# Patient Record
Sex: Male | Born: 1965 | Race: White | Hispanic: No | Marital: Married | Smoking: Former smoker
Health system: Southern US, Community
[De-identification: ages and names within clinical notes are randomized; demographics above are authoritative.]

## PROBLEM LIST (undated history)

## (undated) DIAGNOSIS — M1990 Unspecified osteoarthritis, unspecified site: Secondary | ICD-10-CM

## (undated) DIAGNOSIS — G4730 Sleep apnea, unspecified: Secondary | ICD-10-CM

## (undated) DIAGNOSIS — I10 Essential (primary) hypertension: Secondary | ICD-10-CM

## (undated) HISTORY — PX: ROTATOR CUFF REPAIR: SHX139

---

## 1987-02-18 HISTORY — PX: APPENDECTOMY: SHX54

## 2004-02-18 HISTORY — PX: KNEE ARTHROSCOPY: SHX127

## 2017-10-12 ENCOUNTER — Other Ambulatory Visit: Payer: Self-pay | Admitting: Sports Medicine

## 2017-10-12 DIAGNOSIS — M25561 Pain in right knee: Secondary | ICD-10-CM

## 2017-10-12 DIAGNOSIS — M25461 Effusion, right knee: Secondary | ICD-10-CM

## 2017-10-15 ENCOUNTER — Ambulatory Visit
Admission: RE | Admit: 2017-10-15 | Discharge: 2017-10-15 | Disposition: A | Discharge status: Home / Self Care | Payer: BLUE CROSS/BLUE SHIELD | Source: Ambulatory Visit | Attending: Sports Medicine | Admitting: Sports Medicine

## 2017-10-15 DIAGNOSIS — M1711 Unilateral primary osteoarthritis, right knee: Secondary | ICD-10-CM | POA: Insufficient documentation

## 2017-10-15 DIAGNOSIS — M25461 Effusion, right knee: Secondary | ICD-10-CM

## 2017-10-15 DIAGNOSIS — M7121 Synovial cyst of popliteal space [Baker], right knee: Secondary | ICD-10-CM | POA: Insufficient documentation

## 2017-10-15 DIAGNOSIS — X58XXXA Exposure to other specified factors, initial encounter: Secondary | ICD-10-CM | POA: Insufficient documentation

## 2017-10-15 DIAGNOSIS — S83231A Complex tear of medial meniscus, current injury, right knee, initial encounter: Secondary | ICD-10-CM | POA: Insufficient documentation

## 2017-10-15 DIAGNOSIS — M25561 Pain in right knee: Secondary | ICD-10-CM | POA: Diagnosis present

## 2017-10-22 ENCOUNTER — Other Ambulatory Visit: Payer: Self-pay

## 2017-10-22 ENCOUNTER — Encounter
Admission: RE | Admit: 2017-10-22 | Discharge: 2017-10-22 | Disposition: A | Discharge status: Home / Self Care | Payer: BLUE CROSS/BLUE SHIELD | Source: Ambulatory Visit | Attending: Orthopedic Surgery | Admitting: Orthopedic Surgery

## 2017-10-22 HISTORY — DX: Sleep apnea, unspecified: G47.30

## 2017-10-22 HISTORY — DX: Essential (primary) hypertension: I10

## 2017-10-22 HISTORY — DX: Unspecified osteoarthritis, unspecified site: M19.90

## 2017-10-22 NOTE — Patient Instructions (Signed)
Your procedure is scheduled on: 10-26-17 Report to Same Day Surgery 2nd floor medical mall [facility] Entrance-take elevator on left to 2nd floor.  Check in with surgery information desk.) To find out your arrival time please call [phone] between 1PM - 3PM on 10-23-17  Remember: Instructions that are not followed completely may result in serious medical risk, up to and including death, or upon the discretion of your surgeon and anesthesiologist your surgery may need to be rescheduled.    _x___ 1. Do not eat food after midnight the night before your procedure. You may drink clear liquids up to 2 hours before you are scheduled to arrive at the hospital for your procedure.  Do not drink clear liquids within 2 hours of your scheduled arrival to the hospital.  Clear liquids include  --Water or Apple juice without pulp  --Clear carbohydrate beverage such as ClearFast or Gatorade  --Black Coffee or Clear Tea (No milk, no creamers, do not add anything to the coffee or Tea   ____Ensure clear carbohydrate drink on the way to the hospital for bariatric patients  ____Ensure clear carbohydrate drink 3 hours before surgery for Dr Dwyane Luo patients if physician instructed.   No gum chewing or hard candies.     __x__ 2. No Alcohol for 24 hours before or after surgery.   __x__3. No Smoking or e-cigarettes for 24 prior to surgery.  Do not use any chewable tobacco products for at least 6 hour prior to surgery   ____  4. Bring all medications with you on the day of surgery if instructed.    __x__ 5. Notify your doctor if there is any change in your medical condition     (cold, fever, infections).    x___6. On the morning of surgery brush your teeth with toothpaste and water.  You may rinse your mouth with mouth wash if you wish.  Do not swallow any toothpaste or mouthwash.   Do not wear jewelry, make-up, hairpins, clips or nail polish.  Do not wear lotions, powders, or perfumes. You may wear  deodorant.  Do not shave 48 hours prior to surgery. Men may shave face and neck.  Do not bring valuables to the hospital.    [facility] is not responsible for any belongings or valuables.               Contacts, dentures or bridgework may not be worn into surgery.  Leave your suitcase in the car. After surgery it may be brought to your room.  For patients admitted to the hospital, discharge time is determined by your treatment team.  _  Patients discharged the day of surgery will not be allowed to drive home.  You will need someone to drive you home and stay with you the night of your procedure.    Please read over the following fact sheets that you were given:   [facility] Preparing for Surgery and or MRSA Information   _x___ TAKE THE FOLLOWING MEDICATION THE MORNING OF SURGERY WITH A SMALL SIP OF WATER. These include:  1. OTEZLA  2.  3.  4.  5.  6.  ____Fleets enema or Magnesium Citrate as directed.   ____ Use CHG Soap or sage wipes as directed on instruction sheet   ____ Use inhalers on the day of surgery and bring to hospital day of surgery  ____ Stop Metformin and Janumet 2 days prior to surgery.    ____ Take 1/2 of usual insulin dose  the night before surgery and none on the morning     surgery.   ____ Follow recommendations from Cardiologist, Pulmonologist or PCP regarding          stopping Aspirin, Coumadin, Plavix ,Eliquis, Effient, or Pradaxa, and Pletal.  X____Stop Anti-inflammatories such as Advil, Aleve, Ibuprofen, Motrin, Naproxen, MELOXICAM, Naprosyn, Goodies powders or aspirin products NOW-OK to take Tylenol   _x___ Stop supplements until after surgery-STOP MELATONIN NOW   _X___ Bring C-Pap to the hospital.

## 2017-10-22 NOTE — Pre-Procedure Instructions (Signed)
PT NEEDING TO COME IN FOR LABS AND EKG PREOP-PT STATES HE IS OUT OF TOWN AND WILL NOT BE COMING BACK UNTIL Friday EVENING.  SURGERY ON Monday.  WILL HAVE TO GET LABS AND EKG DONE AM OF SURGERY

## 2017-10-25 MED ORDER — DEXTROSE 5 % IV SOLN
3.0000 g | Freq: Once | INTRAVENOUS | Status: AC
  Administered 2017-10-26: 3 g via INTRAVENOUS
  Filled 2017-10-25: qty 3000

## 2017-10-26 ENCOUNTER — Other Ambulatory Visit: Payer: Self-pay

## 2017-10-26 ENCOUNTER — Ambulatory Visit: Payer: BLUE CROSS/BLUE SHIELD | Admitting: Anesthesiology

## 2017-10-26 ENCOUNTER — Encounter
Admission: RE | Disposition: A | Discharge status: Home / Self Care | Payer: Self-pay | Source: Ambulatory Visit | Attending: Orthopedic Surgery

## 2017-10-26 ENCOUNTER — Ambulatory Visit
Admission: RE | Admit: 2017-10-26 | Discharge: 2017-10-26 | Disposition: A | Discharge status: Home / Self Care | Payer: BLUE CROSS/BLUE SHIELD | Source: Ambulatory Visit | Attending: Orthopedic Surgery | Admitting: Orthopedic Surgery

## 2017-10-26 ENCOUNTER — Encounter: Payer: Self-pay | Admitting: *Deleted

## 2017-10-26 DIAGNOSIS — M6751 Plica syndrome, right knee: Secondary | ICD-10-CM | POA: Diagnosis not present

## 2017-10-26 DIAGNOSIS — S83231A Complex tear of medial meniscus, current injury, right knee, initial encounter: Secondary | ICD-10-CM | POA: Diagnosis not present

## 2017-10-26 DIAGNOSIS — X500XXA Overexertion from strenuous movement or load, initial encounter: Secondary | ICD-10-CM | POA: Insufficient documentation

## 2017-10-26 DIAGNOSIS — Z79899 Other long term (current) drug therapy: Secondary | ICD-10-CM | POA: Diagnosis not present

## 2017-10-26 DIAGNOSIS — M2341 Loose body in knee, right knee: Secondary | ICD-10-CM | POA: Diagnosis not present

## 2017-10-26 DIAGNOSIS — I10 Essential (primary) hypertension: Secondary | ICD-10-CM | POA: Insufficient documentation

## 2017-10-26 DIAGNOSIS — L409 Psoriasis, unspecified: Secondary | ICD-10-CM | POA: Insufficient documentation

## 2017-10-26 DIAGNOSIS — Y9289 Other specified places as the place of occurrence of the external cause: Secondary | ICD-10-CM | POA: Insufficient documentation

## 2017-10-26 DIAGNOSIS — Z8489 Family history of other specified conditions: Secondary | ICD-10-CM | POA: Insufficient documentation

## 2017-10-26 DIAGNOSIS — Z9109 Other allergy status, other than to drugs and biological substances: Secondary | ICD-10-CM | POA: Diagnosis not present

## 2017-10-26 DIAGNOSIS — S83241A Other tear of medial meniscus, current injury, right knee, initial encounter: Secondary | ICD-10-CM | POA: Diagnosis present

## 2017-10-26 HISTORY — PX: KNEE ARTHROSCOPY WITH MEDIAL MENISECTOMY: SHX5651

## 2017-10-26 LAB — BASIC METABOLIC PANEL
Anion gap: 7 (ref 5–15)
BUN: 15 mg/dL (ref 6–20)
CO2: 27 mmol/L (ref 22–32)
CREATININE: 0.88 mg/dL (ref 0.61–1.24)
Calcium: 9 mg/dL (ref 8.9–10.3)
Chloride: 103 mmol/L (ref 98–111)
GFR calc Af Amer: >60 mL/min (ref >60)
GFR calc non Af Amer: >60 mL/min (ref >60)
Glucose, Bld: 99 mg/dL (ref 70–99)
Potassium: 3.7 mmol/L (ref 3.5–5.1)
SODIUM: 137 mmol/L (ref 135–145)

## 2017-10-26 SURGERY — ARTHROSCOPY, KNEE, WITH MEDIAL MENISCECTOMY
Anesthesia: General | Site: Knee | Laterality: Right | Wound class: "Clean "

## 2017-10-26 MED ORDER — EPINEPHRINE 30 MG/30ML IJ SOLN
INTRAMUSCULAR | Status: AC
  Filled 2017-10-26: qty 1

## 2017-10-26 MED ORDER — SUGAMMADEX SODIUM 500 MG/5ML IV SOLN
INTRAVENOUS | Status: AC
  Filled 2017-10-26: qty 5

## 2017-10-26 MED ORDER — FENTANYL CITRATE (PF) 100 MCG/2ML IJ SOLN
INTRAMUSCULAR | Status: DC | PRN
  Administered 2017-10-26: 50 ug via INTRAVENOUS
  Administered 2017-10-26: 200 ug via INTRAVENOUS

## 2017-10-26 MED ORDER — ONDANSETRON 4 MG PO TBDP: 4.0000 mg | ORAL_TABLET | Freq: Three times a day (TID) | ORAL | 0 refills | Status: DC | PRN

## 2017-10-26 MED ORDER — FENTANYL CITRATE (PF) 100 MCG/2ML IJ SOLN
INTRAMUSCULAR | Status: AC
  Filled 2017-10-26: qty 2

## 2017-10-26 MED ORDER — KETOROLAC TROMETHAMINE 30 MG/ML IJ SOLN
INTRAMUSCULAR | Status: DC | PRN
  Administered 2017-10-26: 30 mg via INTRAVENOUS

## 2017-10-26 MED ORDER — FENTANYL CITRATE (PF) 250 MCG/5ML IJ SOLN
INTRAMUSCULAR | Status: AC
  Filled 2017-10-26: qty 5

## 2017-10-26 MED ORDER — ASPIRIN EC 325 MG PO TBEC: 325.0000 mg | DELAYED_RELEASE_TABLET | Freq: Every day | ORAL | 0 refills | Status: AC

## 2017-10-26 MED ORDER — LACTATED RINGERS IV SOLN
INTRAVENOUS | Status: DC
  Administered 2017-10-26: 11:00:00 via INTRAVENOUS

## 2017-10-26 MED ORDER — ACETAMINOPHEN 500 MG PO TABS: 1000.0000 mg | ORAL_TABLET | Freq: Three times a day (TID) | ORAL | 2 refills | Status: AC

## 2017-10-26 MED ORDER — HYDROCODONE-ACETAMINOPHEN 5-325 MG PO TABS
1.0000 | ORAL_TABLET | Freq: Once | ORAL | Status: AC
  Administered 2017-10-26: 1 via ORAL

## 2017-10-26 MED ORDER — HYDROCODONE-ACETAMINOPHEN 5-325 MG PO TABS
ORAL_TABLET | ORAL | Status: AC
  Filled 2017-10-26: qty 1

## 2017-10-26 MED ORDER — LIDOCAINE HCL (CARDIAC) PF 100 MG/5ML IV SOSY
PREFILLED_SYRINGE | INTRAVENOUS | Status: DC | PRN
  Administered 2017-10-26: 100 mg via INTRAVENOUS

## 2017-10-26 MED ORDER — PROPOFOL 10 MG/ML IV BOLUS
INTRAVENOUS | Status: DC | PRN
  Administered 2017-10-26: 200 mg via INTRAVENOUS

## 2017-10-26 MED ORDER — PROPOFOL 10 MG/ML IV BOLUS
INTRAVENOUS | Status: AC
  Filled 2017-10-26: qty 20

## 2017-10-26 MED ORDER — IBUPROFEN 800 MG PO TABS: 800.0000 mg | ORAL_TABLET | Freq: Three times a day (TID) | ORAL | 0 refills | Status: AC

## 2017-10-26 MED ORDER — ROCURONIUM BROMIDE 50 MG/5ML IV SOLN
INTRAVENOUS | Status: AC
  Filled 2017-10-26: qty 1

## 2017-10-26 MED ORDER — LACTATED RINGERS IV SOLN
INTRAVENOUS | Status: DC | PRN
  Administered 2017-10-26: 6 mL

## 2017-10-26 MED ORDER — HYDROCODONE-ACETAMINOPHEN 5-325 MG PO TABS: 1.0000 | ORAL_TABLET | ORAL | 0 refills | Status: DC | PRN

## 2017-10-26 MED ORDER — FAMOTIDINE 20 MG PO TABS
ORAL_TABLET | ORAL | Status: AC
  Filled 2017-10-26: qty 1

## 2017-10-26 MED ORDER — LIDOCAINE-EPINEPHRINE 1 %-1:100000 IJ SOLN
INTRAMUSCULAR | Status: DC | PRN
  Administered 2017-10-26: 6 mL

## 2017-10-26 MED ORDER — MIDAZOLAM HCL 2 MG/2ML IJ SOLN
INTRAMUSCULAR | Status: AC
  Filled 2017-10-26: qty 2

## 2017-10-26 MED ORDER — ROCURONIUM BROMIDE 100 MG/10ML IV SOLN
INTRAVENOUS | Status: DC | PRN
  Administered 2017-10-26: 10 mg via INTRAVENOUS
  Administered 2017-10-26: 50 mg via INTRAVENOUS
  Administered 2017-10-26: 10 mg via INTRAVENOUS

## 2017-10-26 MED ORDER — FENTANYL CITRATE (PF) 100 MCG/2ML IJ SOLN
25.0000 ug | INTRAMUSCULAR | Status: DC | PRN
  Administered 2017-10-26 (×3): 50 ug via INTRAVENOUS

## 2017-10-26 MED ORDER — BUPIVACAINE HCL (PF) 0.5 % IJ SOLN
INTRAMUSCULAR | Status: AC
  Filled 2017-10-26: qty 30

## 2017-10-26 MED ORDER — LIDOCAINE-EPINEPHRINE 1 %-1:100000 IJ SOLN
INTRAMUSCULAR | Status: AC
  Filled 2017-10-26: qty 1

## 2017-10-26 MED ORDER — PROMETHAZINE HCL 25 MG/ML IJ SOLN: 6.2500 mg | INTRAMUSCULAR | Status: DC | PRN

## 2017-10-26 MED ORDER — MIDAZOLAM HCL 2 MG/2ML IJ SOLN
INTRAMUSCULAR | Status: DC | PRN
  Administered 2017-10-26: 2 mg via INTRAVENOUS

## 2017-10-26 MED ORDER — LIDOCAINE HCL (PF) 2 % IJ SOLN
INTRAMUSCULAR | Status: AC
  Filled 2017-10-26: qty 10

## 2017-10-26 MED ORDER — ONDANSETRON HCL 4 MG/2ML IJ SOLN
INTRAMUSCULAR | Status: DC | PRN
  Administered 2017-10-26: 4 mg via INTRAVENOUS

## 2017-10-26 MED ORDER — ONDANSETRON HCL 4 MG/2ML IJ SOLN
INTRAMUSCULAR | Status: AC
  Filled 2017-10-26: qty 2

## 2017-10-26 MED ORDER — SUGAMMADEX SODIUM 200 MG/2ML IV SOLN
INTRAVENOUS | Status: DC | PRN
  Administered 2017-10-26: 260 mg via INTRAVENOUS

## 2017-10-26 MED ORDER — FAMOTIDINE 20 MG PO TABS
20.0000 mg | ORAL_TABLET | Freq: Once | ORAL | Status: AC
  Administered 2017-10-26: 20 mg via ORAL

## 2017-10-26 MED ORDER — BUPIVACAINE HCL (PF) 0.5 % IJ SOLN
INTRAMUSCULAR | Status: DC | PRN
  Administered 2017-10-26: 6 mL

## 2017-10-26 SURGICAL SUPPLY — 55 items
"PENCIL ELECTRO HAND CTR " (MISCELLANEOUS) IMPLANT
ADAPTER IRRIG TUBE 2 SPIKE SOL (ADAPTER) ×6 IMPLANT
BANDAGE ACE 6X5 VEL STRL LF (GAUZE/BANDAGES/DRESSINGS) ×3 IMPLANT
BLADE SURG SZ11 CARB STEEL (BLADE) ×3 IMPLANT
BNDG COHESIVE 6X5 TAN STRL LF (GAUZE/BANDAGES/DRESSINGS) ×3 IMPLANT
BNDG ESMARK 6X12 TAN STRL LF (GAUZE/BANDAGES/DRESSINGS) ×3 IMPLANT
BUR RADIUS 3.5 (BURR) IMPLANT
BUR RADIUS 4.0X18.5 (BURR) IMPLANT
CAST PADDING 6X4YD ST 30248 (SOFTGOODS) ×2
CHLORAPREP W/TINT 26ML (MISCELLANEOUS) ×3 IMPLANT
CLOSURE WOUND 1/2 X4 (GAUZE/BANDAGES/DRESSINGS)
COOLER POLAR GLACIER W/PUMP (MISCELLANEOUS) ×3 IMPLANT
CUFF TOURN 24 STER (MISCELLANEOUS) IMPLANT
CUFF TOURN 30 STER DUAL PORT (MISCELLANEOUS) IMPLANT
CUFF TOURN 34 STER (MISCELLANEOUS) ×2 IMPLANT
DEVICE SUCT BLK HOLE OR FLOOR (MISCELLANEOUS) ×1 IMPLANT
DRAPE IMP U-DRAPE 54X76 (DRAPES) ×3 IMPLANT
DRAPE LEGGINS SURG 28X43 STRL (DRAPES) IMPLANT
ELECT REM PT RETURN 9FT ADLT (ELECTROSURGICAL)
ELECTRODE REM PT RTRN 9FT ADLT (ELECTROSURGICAL) IMPLANT
GAUZE SPONGE 4X4 12PLY STRL (GAUZE/BANDAGES/DRESSINGS) ×3 IMPLANT
GLOVE BIOGEL PI IND STRL 8 (GLOVE) ×1 IMPLANT
GLOVE BIOGEL PI INDICATOR 8 (GLOVE) ×2
GLOVE SURG ORTHO 8.0 STRL STRW (GLOVE) ×6 IMPLANT
GOWN STRL REUS W/ TWL LRG LVL3 (GOWN DISPOSABLE) ×1 IMPLANT
GOWN STRL REUS W/ TWL XL LVL3 (GOWN DISPOSABLE) ×1 IMPLANT
GOWN STRL REUS W/TWL LRG LVL3 (GOWN DISPOSABLE) ×2
GOWN STRL REUS W/TWL XL LVL3 (GOWN DISPOSABLE) ×2
IV LACTATED RINGER IRRG 3000ML (IV SOLUTION) ×12
IV LR IRRIG 3000ML ARTHROMATIC (IV SOLUTION) ×4 IMPLANT
KIT TURNOVER KIT A (KITS) ×3 IMPLANT
MANIFOLD NEPTUNE II (INSTRUMENTS) ×3 IMPLANT
MAT ABSORB  FLUID 56X50 GRAY (MISCELLANEOUS) ×4
MAT ABSORB FLUID 56X50 GRAY (MISCELLANEOUS) ×2 IMPLANT
NDL MAYO CATGUT SZ5 (NEEDLE)
NDL SUT 5 .5 CRC TPR PNT MAYO (NEEDLE) IMPLANT
NEEDLE HYPO 22GX1.5 SAFETY (NEEDLE) ×3 IMPLANT
PACK ARTHROSCOPY KNEE (MISCELLANEOUS) ×3 IMPLANT
PAD ABD DERMACEA PRESS 5X9 (GAUZE/BANDAGES/DRESSINGS) ×6 IMPLANT
PAD WRAPON POLAR KNEE (MISCELLANEOUS) ×1 IMPLANT
PADDING CAST COTTON 6X4 ST (SOFTGOODS) ×1 IMPLANT
PENCIL ELECTRO HAND CTR (MISCELLANEOUS) IMPLANT
SET TUBE SUCT SHAVER OUTFL 24K (TUBING) ×3 IMPLANT
SET TUBE TIP INTRA-ARTICULAR (MISCELLANEOUS) ×3 IMPLANT
STRIP CLOSURE SKIN 1/2X4 (GAUZE/BANDAGES/DRESSINGS) IMPLANT
SUT ETHILON 3-0 FS-10 30 BLK (SUTURE) ×3
SUT MNCRL AB 4-0 PS2 18 (SUTURE) IMPLANT
SUT VIC AB 0 CT2 27 (SUTURE) IMPLANT
SUT VIC AB 2-0 CT2 27 (SUTURE) IMPLANT
SUTURE EHLN 3-0 FS-10 30 BLK (SUTURE) ×1 IMPLANT
TOWEL OR 17X26 4PK STRL BLUE (TOWEL DISPOSABLE) ×6 IMPLANT
TUBING ARTHRO INFLOW-ONLY STRL (TUBING) ×3 IMPLANT
WAND HAND CNTRL MULTIVAC 50 (MISCELLANEOUS) IMPLANT
WAND HAND CNTRL MULTIVAC 90 (MISCELLANEOUS) ×2 IMPLANT
WRAPON POLAR PAD KNEE (MISCELLANEOUS) ×3

## 2017-10-26 NOTE — Anesthesia Post-op Follow-up Note (Signed)
Anesthesia QCDR form completed.        

## 2017-10-26 NOTE — Anesthesia Procedure Notes (Signed)
Procedure Name: Intubation Date/Time: 10/26/2017 12:39 PM Performed by: Bernardo Heater, CRNA Pre-anesthesia Checklist: Patient identified, Emergency Drugs available, Suction available and Patient being monitored Patient Re-evaluated:Patient Re-evaluated prior to induction Oxygen Delivery Method: Circle system utilized Preoxygenation: Pre-oxygenation with 100% oxygen Induction Type: IV induction Laryngoscope Size: Mac and 3 Grade View: Grade I Tube size: 7.5 mm Number of attempts: 1 Placement Confirmation: ETT inserted through vocal cords under direct vision,  positive ETCO2 and breath sounds checked- equal and bilateral Secured at: 23 cm Tube secured with: Tape Dental Injury: Teeth and Oropharynx as per pre-operative assessment

## 2017-10-26 NOTE — Transfer of Care (Signed)
Immediate Anesthesia Transfer of Care Note  Patient: Timothy Doyle  Procedure(s) Performed: KNEE ARTHROSCOPY WITH MEDIAL MENISECTOMY (Right Knee)  Patient Location: PACU  Anesthesia Type:General  Level of Consciousness: awake and patient cooperative  Airway & Oxygen Therapy: Patient Spontanous Breathing and Patient connected to nasal cannula oxygen  Post-op Assessment: Report given to RN and Post -op Vital signs reviewed and stable  Post vital signs: Reviewed and stable  Last Vitals:  Vitals Value Taken Time  BP    Temp 36.4 C 10/26/2017  2:03 PM  Pulse 92 10/26/2017  2:03 PM  Resp 17 10/26/2017  2:03 PM  SpO2 100 % 10/26/2017  2:03 PM  Vitals shown include unvalidated device data.  Last Pain:  Vitals:   10/26/17 1048  TempSrc: Temporal  PainSc: 4          Complications: No apparent anesthesia complications

## 2017-10-26 NOTE — H&P (Signed)
Paper H&P to be scanned into permanent record. H&P reviewed. No significant changes noted.  

## 2017-10-26 NOTE — Op Note (Signed)
Operative Note    SURGERY DATE: 10/26/2017   PRE-OP DIAGNOSIS:  1. Right medial meniscus tear   POST-OP DIAGNOSIS:  1. Right medial meniscus tear 2. Right patella degenerative changes 3. Right knee loose bodies  4. Right knee medial plica   PROCEDURES:  1. Right knee arthroscopy, partial medial meniscectomy (parrot beak tear of posterior horn affecting ~40% meniscus width, and 80% radial tear of posterior horn meniscus root) 2. Chondroplasty of patellofemoral compartment 3. Removal of loose bodies from lateral compartment 4. Medial plica excision   SURGEON: Cato Mulligan, MD   ANESTHESIA: Gen   ESTIMATED BLOOD LOSS: minimal   TOTAL IV FLUIDS: per anesthesia   INDICATION(S):  Timothy Doyle is a 52 y.o. male with signs and symptoms as well as MRI finding of medial meniscus tear. He had an acute injury when he rolled over in bed ~3 weeks ago. He failed non-operative management in the form of activity modification and NSAIDs. After discussion of risks, benefits, and alternatives to surgery, the patient elected to proceed.   OPERATIVE FINDINGS:    Examination under anesthesia: A careful examination under anesthesia was performed.  Passive range of motion was: Hyperextension: 2.  Extension: 0.  Flexion: 120.  Lachman: normal. Pivot Shift: normal.  Posterior drawer: normal.  Varus stability in full extension: normal.  Varus stability in 30 degrees of flexion: normal.  Valgus stability in full extension: normal.  Valgus stability in 30 degrees of flexion: normal.   Intra-operative findings: A thorough arthroscopic examination of the knee was performed.  The findings are: 1. Suprapatellar pouch: Normal 2. Undersurface of median ridge: Normal 3. Medial patellar facet: normal  4. Lateral patellar facet: Grade 2 degenerative changes 5. Trochlea: Grade 1 degenerative changes 6. Lateral gutter/popliteus tendon: two loose bodies, measuring ~43mm and 42mm in length 7. Hoffa's fat pad:  Inflamed 8. Medial gutter/plica: Significant medial plica 9. ACL: Normal 10. PCL: Normal 11. Medial meniscus: Complex tear with parrot beak tear of the posterior horn affecting ~40% meniscus width. There was an additional tear of the medial root, but ~20% of fibers were intact.  12. Medial compartment cartilage: Grade 2 degenerative changes to medial femoral condyle 13. Lateral meniscus: Normal 14. Lateral compartment cartilage: Grade 1 degenerative changes to tibial plateau; separate ~67mm loose body   OPERATIVE REPORT:     I identified Timothy Doyle in the pre-operative holding area. I marked the operative knee with my initials. I reviewed the risks and benefits of the proposed surgical intervention and the patient (and/or patient's guardian) wished to proceed. The patient was transferred to the operative suite and placed in the supine position with all bony prominences padded.  Anesthesia was administered. Appropriate IV antibiotics were administered prior to incision. The extremity was then prepped and draped in standard fashion. A time out was performed confirming the correct extremity, correct patient, and correct procedure.   Arthroscopy portals were marked. Local anesthetic was injected to the planned portal sites. The anterolateral portal was established with an 11 blade.      The arthroscope was placed in the anterolateral portal and then into the suprapatellar pouch.  A diagnostic knee scope was completed with the above findings. The medial meniscus tear was identified.   Next the medial portal was established under needle localization. Loose bodies were removed from the lateral gutter and lateral compartment with an arthroscopic shaver. The MCL was pie-crusted to improve visualization of the posterior horn. The meniscal tear was debrided using an arthroscopic biter  and an oscillating shaver until the meniscus had stable borders. A chondroplasty was performed of the medial compartment and  patellofemoral compartment such that there were stable cartilage edges without any loose fragments of cartilage. Arthroscopic fluid was removed from the joint.   The portals were closed with 3-0 Nylon suture. Sterile dressings included Xeroform, 4x4s, Sof-Rol, and Bias wrap. A Polarcare was placed.  The patient was then awakened and taken to the PACU hemodynamically stable without complication.     POSTOPERATIVE PLAN: The patient will be discharged home today once they meet PACU criteria. Aspirin 325 mg daily was prescribed for 2 weeks for DVT prophylaxis.  Physical therapy will start on POD#3-4. Weight-bearing as tolerated. They will follow up in 2 weeks per protocol.

## 2017-10-26 NOTE — Anesthesia Preprocedure Evaluation (Signed)
Anesthesia Evaluation  Patient identified by MRN, date of birth, ID band Patient awake    Reviewed: Allergy & Precautions, H&P , NPO status , Patient's Chart, lab work & pertinent test results, reviewed documented beta blocker date and time   History of Anesthesia Complications Negative for: history of anesthetic complications  Airway Mallampati: III  TM Distance: >3 FB Neck ROM: full    Dental  (+) Dental Advidsory Given, Teeth Intact   Pulmonary neg shortness of breath, sleep apnea and Continuous Positive Airway Pressure Ventilation , neg COPD, neg recent URI, former smoker,           Cardiovascular Exercise Tolerance: Good hypertension, (-) angina(-) CAD, (-) Past MI, (-) Cardiac Stents and (-) CABG (-) dysrhythmias (-) Valvular Problems/Murmurs     Neuro/Psych negative neurological ROS  negative psych ROS   GI/Hepatic negative GI ROS, Neg liver ROS,   Endo/Other  neg diabetesMorbid obesity  Renal/GU negative Renal ROS  negative genitourinary   Musculoskeletal   Abdominal   Peds  Hematology negative hematology ROS (+)   Anesthesia Other Findings Past Medical History: No date: Arthritis No date: Hypertension No date: Sleep apnea     Comment:  USES CPAP   Reproductive/Obstetrics negative OB ROS                             Anesthesia Physical Anesthesia Plan  ASA: III  Anesthesia Plan: General   Post-op Pain Management:    Induction: Intravenous  PONV Risk Score and Plan: 2 and Ondansetron, Dexamethasone and Treatment may vary due to age or medical condition  Airway Management Planned: LMA  Additional Equipment:   Intra-op Plan:   Post-operative Plan: Extubation in OR  Informed Consent: I have reviewed the patients History and Physical, chart, labs and discussed the procedure including the risks, benefits and alternatives for the proposed anesthesia with the patient or  authorized representative who has indicated his/her understanding and acceptance.   Dental Advisory Given  Plan Discussed with: Anesthesiologist, CRNA and Surgeon  Anesthesia Plan Comments:         Anesthesia Quick Evaluation

## 2017-10-26 NOTE — Discharge Instructions (Signed)
AMBULATORY SURGERY  DISCHARGE INSTRUCTIONS   1) The drugs that you were given will stay in your system until tomorrow so for the next 24 hours you should not:  A) Drive an automobile B) Make any legal decisions C) Drink any alcoholic beverage   2) You may resume regular meals tomorrow.  Today it is better to start with liquids and gradually work up to solid foods.  You may eat anything you prefer, but it is better to start with liquids, then soup and crackers, and gradually work up to solid foods.   3) Please notify your doctor immediately if you have any unusual bleeding, trouble breathing, redness and pain at the surgery site, drainage, fever, or pain not relieved by medication.    4) Additional Instructions:        Please contact your physician with any problems or Same Day Surgery at [phone], Monday through Friday 6 am to 4 pm, or [place] at [facility] number at [phone].Arthroscopic Knee Surgery - Partial Meniscectomy   Post-Op Instructions   1. Bracing or crutches: Crutches will be provided at the time of discharge from the surgery center if you do not already have them.   2. Ice: You may be provided with a device [facility]) that allows you to ice the affected area effectively. Otherwise you can ice manually.    3. Driving:  Plan on not driving for at least two weeks. Please note that you are advised NOT to drive while taking narcotic pain medications as you may be impaired and unsafe to drive.   4. Activity: Ankle pumps several times an hour while awake to prevent blood clots. Weight bearing: as tolerated. Use crutches for assistance with gait. Use until you are ambulating with crutches and without a limp. Can then wean from crutches. You can have assistance from your physical therapist with this. Bending and straightening the knee is unlimited. Elevate knee above heart level as much as possible for one week. Avoid standing more than 5 minutes  (consecutively) for the first week.  Avoid long distance travel for 2 weeks.  5. Medications:  - You have been provided a prescription for narcotic pain medicine. After surgery, take 1-2 narcotic tablets every 4 hours if needed for severe pain.  - You may take up to 3000mg /day of tylenol (acetaminophen). You can take 1000mg  3x/day. Please check your narcotic. If you have acetaminophen in your narcotic (each tablet will be 325mg ), be careful not to exceed a total of 3000mg /day of acetaminophen.  - A prescription for anti-nausea medication will be provided in case the narcotic medicine or anesthesia causes nausea - take 1 tablet every 6 hours only if nauseated.  - Take ibuprofen 800 mg every 8 hours WITH food to reduce post-operative knee swelling. DO NOT STOP IBUPROFEN POST-OP UNTIL INSTRUCTED TO DO SO at first post-op office visit (10-14 days after surgery). However, please discontinue if you have any abdominal discomfort after taking this.  - Take enteric coated aspirin 325 mg once daily for 2 weeks to prevent blood clots.    6. Bandages: The physical therapist should change the bandages at the first post-op appointment. If needed, the dressing supplies have been provided to you.   7. Physical Therapy: 1-2 times per week for 6 weeks. Therapy typically starts on post operative Day 3 or 4. You have been provided an order for physical therapy. The therapist will provide home exercises.   8. Work: May return to full work usually  around 2 weeks after 1st post-operative visit. May do light duty/desk job in approximately 1-2 weeks when off of narcotics, pain is well-controlled, and swelling has decreased. Labor intensive jobs may require 4-6 weeks to return.      9. Post-Op Appointments: Your first post-op appointment will be with Dr. Posey Pronto in approximately 2 weeks time.    If you find that they have not been scheduled please call the Orthopaedic Appointment front desk at [phone].

## 2017-10-27 ENCOUNTER — Encounter: Payer: Self-pay | Admitting: Orthopedic Surgery

## 2017-10-27 NOTE — Anesthesia Postprocedure Evaluation (Signed)
Anesthesia Post Note  Patient: Timothy Doyle  Procedure(s) Performed: KNEE ARTHROSCOPY WITH MEDIAL MENISECTOMY (Right Knee)  Patient location during evaluation: PACU Anesthesia Type: General Level of consciousness: awake and alert Pain management: pain level controlled Vital Signs Assessment: post-procedure vital signs reviewed and stable Respiratory status: spontaneous breathing, nonlabored ventilation, respiratory function stable and patient connected to nasal cannula oxygen Cardiovascular status: blood pressure returned to baseline and stable Postop Assessment: no apparent nausea or vomiting Anesthetic complications: no     Last Vitals:  Vitals:   10/26/17 1525 10/26/17 1651  BP: (!) 158/91 (!) 148/86  Pulse: 65 78  Resp: 16 15  Temp: 36.9 C   SpO2: 100% 99%    Last Pain:  Vitals:   10/26/17 1651  TempSrc:   PainSc: 3                  Martha Clan

## 2017-10-30 ENCOUNTER — Encounter: Payer: Self-pay | Admitting: Orthopedic Surgery

## 2017-12-16 ENCOUNTER — Other Ambulatory Visit: Payer: Self-pay | Admitting: Orthopedic Surgery

## 2017-12-16 DIAGNOSIS — M25562 Pain in left knee: Principal | ICD-10-CM

## 2017-12-16 DIAGNOSIS — G8929 Other chronic pain: Secondary | ICD-10-CM

## 2018-01-05 ENCOUNTER — Ambulatory Visit
Admission: RE | Admit: 2018-01-05 | Discharge: 2018-01-05 | Disposition: A | Discharge status: Home / Self Care | Payer: BLUE CROSS/BLUE SHIELD | Source: Ambulatory Visit | Attending: Orthopedic Surgery | Admitting: Orthopedic Surgery

## 2018-01-05 DIAGNOSIS — S83242A Other tear of medial meniscus, current injury, left knee, initial encounter: Secondary | ICD-10-CM | POA: Insufficient documentation

## 2018-01-05 DIAGNOSIS — X58XXXA Exposure to other specified factors, initial encounter: Secondary | ICD-10-CM | POA: Insufficient documentation

## 2018-01-05 DIAGNOSIS — M25562 Pain in left knee: Secondary | ICD-10-CM | POA: Insufficient documentation

## 2018-01-05 DIAGNOSIS — M794 Hypertrophy of (infrapatellar) fat pad: Secondary | ICD-10-CM | POA: Insufficient documentation

## 2018-01-05 DIAGNOSIS — S83282A Other tear of lateral meniscus, current injury, left knee, initial encounter: Secondary | ICD-10-CM | POA: Insufficient documentation

## 2018-01-05 DIAGNOSIS — M1712 Unilateral primary osteoarthritis, left knee: Secondary | ICD-10-CM | POA: Insufficient documentation

## 2018-01-05 DIAGNOSIS — G8929 Other chronic pain: Secondary | ICD-10-CM | POA: Insufficient documentation

## 2018-01-05 DIAGNOSIS — M25462 Effusion, left knee: Secondary | ICD-10-CM | POA: Diagnosis not present

## 2018-01-05 DIAGNOSIS — M7122 Synovial cyst of popliteal space [Baker], left knee: Secondary | ICD-10-CM | POA: Insufficient documentation

## 2018-02-02 ENCOUNTER — Other Ambulatory Visit: Payer: Self-pay

## 2018-02-02 ENCOUNTER — Encounter: Payer: Self-pay | Admitting: *Deleted

## 2018-02-11 ENCOUNTER — Ambulatory Visit
Admission: RE | Admit: 2018-02-11 | Discharge: 2018-02-11 | Disposition: A | Discharge status: Home / Self Care | Payer: BLUE CROSS/BLUE SHIELD | Source: Ambulatory Visit | Attending: Orthopedic Surgery | Admitting: Orthopedic Surgery

## 2018-02-11 ENCOUNTER — Encounter
Admission: RE | Disposition: A | Discharge status: Home / Self Care | Payer: Self-pay | Source: Ambulatory Visit | Attending: Orthopedic Surgery

## 2018-02-11 ENCOUNTER — Ambulatory Visit: Payer: BLUE CROSS/BLUE SHIELD | Admitting: Anesthesiology

## 2018-02-11 DIAGNOSIS — M659 Synovitis and tenosynovitis, unspecified: Secondary | ICD-10-CM | POA: Diagnosis not present

## 2018-02-11 DIAGNOSIS — G4730 Sleep apnea, unspecified: Secondary | ICD-10-CM | POA: Insufficient documentation

## 2018-02-11 DIAGNOSIS — Z87891 Personal history of nicotine dependence: Secondary | ICD-10-CM | POA: Insufficient documentation

## 2018-02-11 DIAGNOSIS — I10 Essential (primary) hypertension: Secondary | ICD-10-CM | POA: Insufficient documentation

## 2018-02-11 DIAGNOSIS — S83232A Complex tear of medial meniscus, current injury, left knee, initial encounter: Secondary | ICD-10-CM | POA: Insufficient documentation

## 2018-02-11 DIAGNOSIS — X501XXA Overexertion from prolonged static or awkward postures, initial encounter: Secondary | ICD-10-CM | POA: Diagnosis not present

## 2018-02-11 DIAGNOSIS — Z9989 Dependence on other enabling machines and devices: Secondary | ICD-10-CM | POA: Diagnosis not present

## 2018-02-11 HISTORY — PX: KNEE ARTHROSCOPY WITH MEDIAL MENISECTOMY: SHX5651

## 2018-02-11 SURGERY — ARTHROSCOPY, KNEE, WITH MEDIAL MENISCECTOMY
Anesthesia: General | Laterality: Left

## 2018-02-11 MED ORDER — ONDANSETRON 4 MG PO TBDP: 4.0000 mg | ORAL_TABLET | Freq: Three times a day (TID) | ORAL | 0 refills | Status: DC | PRN

## 2018-02-11 MED ORDER — CEFAZOLIN SODIUM-DEXTROSE 2-4 GM/100ML-% IV SOLN
2.0000 g | Freq: Once | INTRAVENOUS | Status: AC
  Administered 2018-02-11: 2 g via INTRAVENOUS

## 2018-02-11 MED ORDER — FENTANYL CITRATE (PF) 100 MCG/2ML IJ SOLN
INTRAMUSCULAR | Status: DC | PRN
  Administered 2018-02-11 (×4): 25 ug via INTRAVENOUS

## 2018-02-11 MED ORDER — ACETAMINOPHEN 160 MG/5ML PO SOLN: 325.0000 mg | ORAL | Status: DC | PRN

## 2018-02-11 MED ORDER — MIDAZOLAM HCL 5 MG/5ML IJ SOLN
INTRAMUSCULAR | Status: DC | PRN
  Administered 2018-02-11: 2 mg via INTRAVENOUS

## 2018-02-11 MED ORDER — HYDROCODONE-ACETAMINOPHEN 5-325 MG PO TABS: 1.0000 | ORAL_TABLET | ORAL | 0 refills | Status: DC | PRN

## 2018-02-11 MED ORDER — ACETAMINOPHEN 500 MG PO TABS: 1000.0000 mg | ORAL_TABLET | Freq: Three times a day (TID) | ORAL | 2 refills | Status: DC

## 2018-02-11 MED ORDER — LACTATED RINGERS IV SOLN
INTRAVENOUS | Status: DC
  Administered 2018-02-11: 13:00:00 via INTRAVENOUS

## 2018-02-11 MED ORDER — BUPIVACAINE HCL (PF) 0.5 % IJ SOLN
INTRAMUSCULAR | Status: DC | PRN
  Administered 2018-02-11: 10 mL
  Administered 2018-02-11: 4 mL

## 2018-02-11 MED ORDER — LIDOCAINE-EPINEPHRINE 1 %-1:100000 IJ SOLN
INTRAMUSCULAR | Status: DC | PRN
  Administered 2018-02-11: 10 mL
  Administered 2018-02-11: 4 mL

## 2018-02-11 MED ORDER — HYDROMORPHONE HCL 1 MG/ML IJ SOLN
0.2500 mg | INTRAMUSCULAR | Status: DC | PRN
  Administered 2018-02-11 (×2): 0.5 mg via INTRAVENOUS

## 2018-02-11 MED ORDER — ACETAMINOPHEN 325 MG PO TABS: 325.0000 mg | ORAL_TABLET | ORAL | Status: DC | PRN

## 2018-02-11 MED ORDER — PROPOFOL 10 MG/ML IV BOLUS
INTRAVENOUS | Status: DC | PRN
  Administered 2018-02-11: 200 mg via INTRAVENOUS

## 2018-02-11 MED ORDER — ONDANSETRON HCL 4 MG/2ML IJ SOLN
4.0000 mg | Freq: Once | INTRAMUSCULAR | Status: AC | PRN
  Administered 2018-02-11: 4 mg via INTRAVENOUS

## 2018-02-11 MED ORDER — DEXAMETHASONE SODIUM PHOSPHATE 4 MG/ML IJ SOLN
INTRAMUSCULAR | Status: DC | PRN
  Administered 2018-02-11: 4 mg via INTRAVENOUS

## 2018-02-11 MED ORDER — LIDOCAINE HCL (CARDIAC) PF 100 MG/5ML IV SOSY
PREFILLED_SYRINGE | INTRAVENOUS | Status: DC | PRN
  Administered 2018-02-11: 30 mg via INTRATRACHEAL

## 2018-02-11 MED ORDER — ASPIRIN EC 325 MG PO TBEC: 325.0000 mg | DELAYED_RELEASE_TABLET | Freq: Every day | ORAL | 0 refills | Status: AC

## 2018-02-11 MED ORDER — GLYCOPYRROLATE 0.2 MG/ML IJ SOLN
INTRAMUSCULAR | Status: DC | PRN
  Administered 2018-02-11: 0.1 mg via INTRAVENOUS

## 2018-02-11 MED ORDER — OXYCODONE HCL 5 MG/5ML PO SOLN: 5.0000 mg | Freq: Once | ORAL | Status: AC | PRN

## 2018-02-11 MED ORDER — OXYCODONE HCL 5 MG PO TABS
5.0000 mg | ORAL_TABLET | Freq: Once | ORAL | Status: AC | PRN
  Administered 2018-02-11: 5 mg via ORAL

## 2018-02-11 MED ORDER — IBUPROFEN 800 MG PO TABS: 800.0000 mg | ORAL_TABLET | Freq: Three times a day (TID) | ORAL | 0 refills | Status: AC

## 2018-02-11 SURGICAL SUPPLY — 38 items
ADAPTER IRRIG TUBE 2 SPIKE SOL (ADAPTER) ×6 IMPLANT
BLADE SURG SZ11 CARB STEEL (BLADE) ×3 IMPLANT
BNDG COHESIVE 4X5 TAN STRL (GAUZE/BANDAGES/DRESSINGS) ×3 IMPLANT
BNDG ESMARK 6X12 TAN STRL LF (GAUZE/BANDAGES/DRESSINGS) ×3 IMPLANT
BUR RADIUS 4.0X18.5 (BURR) ×2 IMPLANT
CHLORAPREP W/TINT 26ML (MISCELLANEOUS) ×3 IMPLANT
COOLER POLAR GLACIER W/PUMP (MISCELLANEOUS) ×3 IMPLANT
COVER LIGHT HANDLE UNIVERSAL (MISCELLANEOUS) ×6 IMPLANT
CUFF TOURN SGL QUICK 30 (MISCELLANEOUS) ×2
CUFF TRNQT CYL LO 30X4X (MISCELLANEOUS) IMPLANT
DRAPE IMP U-DRAPE 54X76 (DRAPES) ×3 IMPLANT
GAUZE SPONGE 4X4 12PLY STRL (GAUZE/BANDAGES/DRESSINGS) ×3 IMPLANT
GLOVE BIO SURGEON STRL SZ7.5 (GLOVE) ×3 IMPLANT
GLOVE BIOGEL PI IND STRL 8 (GLOVE) ×1 IMPLANT
GLOVE BIOGEL PI INDICATOR 8 (GLOVE) ×2
GOWN STRL REUS W/ TWL LRG LVL3 (GOWN DISPOSABLE) ×1 IMPLANT
GOWN STRL REUS W/ TWL XL LVL3 (GOWN DISPOSABLE) ×1 IMPLANT
GOWN STRL REUS W/TWL LRG LVL3 (GOWN DISPOSABLE) ×2
GOWN STRL REUS W/TWL XL LVL3 (GOWN DISPOSABLE) ×2
IV LACTATED RINGER IRRG 3000ML (IV SOLUTION) ×20
IV LR IRRIG 3000ML ARTHROMATIC (IV SOLUTION) ×4 IMPLANT
KIT TURNOVER KIT A (KITS) ×3 IMPLANT
MAT ABSORB  FLUID 56X50 GRAY (MISCELLANEOUS) ×2
MAT ABSORB FLUID 56X50 GRAY (MISCELLANEOUS) ×1 IMPLANT
NEPTUNE MANIFOLD (MISCELLANEOUS) ×3 IMPLANT
NS IRRIG 500ML POUR BTL (IV SOLUTION) ×3 IMPLANT
PACK ARTHROSCOPY KNEE (MISCELLANEOUS) ×3 IMPLANT
PAD ABD DERMACEA PRESS 5X9 (GAUZE/BANDAGES/DRESSINGS) ×2 IMPLANT
PAD WRAPON POLAR KNEE (MISCELLANEOUS) ×1 IMPLANT
PADDING CAST BLEND 6X4 STRL (MISCELLANEOUS) ×1 IMPLANT
PADDING STRL CAST 6IN (MISCELLANEOUS) ×2
SET TUBE SUCT SHAVER OUTFL 24K (TUBING) ×3 IMPLANT
SET TUBE TIP INTRA-ARTICULAR (MISCELLANEOUS) ×3 IMPLANT
SUT ETHILON 3-0 FS-10 30 BLK (SUTURE) ×3
SUTURE EHLN 3-0 FS-10 30 BLK (SUTURE) ×1 IMPLANT
TUBING ARTHRO INFLOW-ONLY STRL (TUBING) ×3 IMPLANT
WAND HAND CNTRL MULTIVAC 90 (MISCELLANEOUS) ×2 IMPLANT
WRAPON POLAR PAD KNEE (MISCELLANEOUS) ×3

## 2018-02-11 NOTE — Anesthesia Preprocedure Evaluation (Addendum)
Anesthesia Evaluation  Patient identified by MRN, date of birth, ID band Patient awake    Reviewed: Allergy & Precautions, H&P , NPO status , Patient's Chart, lab work & pertinent test results, reviewed documented beta blocker date and time   Airway Mallampati: II  TM Distance: >3 FB Neck ROM: full    Dental no notable dental hx.    Pulmonary sleep apnea and Continuous Positive Airway Pressure Ventilation , former smoker,    Pulmonary exam normal breath sounds clear to auscultation       Cardiovascular Exercise Tolerance: Good hypertension, Normal cardiovascular exam Rhythm:regular Rate:Normal     Neuro/Psych negative neurological ROS  negative psych ROS   GI/Hepatic negative GI ROS, Neg liver ROS,   Endo/Other  negative endocrine ROS  Renal/GU negative Renal ROS  negative genitourinary   Musculoskeletal   Abdominal   Peds  Hematology negative hematology ROS (+)   Anesthesia Other Findings   Reproductive/Obstetrics negative OB ROS                           Anesthesia Physical Anesthesia Plan  ASA: II  Anesthesia Plan: General LMA   Post-op Pain Management:    Induction:   PONV Risk Score and Plan: Ondansetron and Dexamethasone  Airway Management Planned:   Additional Equipment:   Intra-op Plan:   Post-operative Plan:   Informed Consent: I have reviewed the patients History and Physical, chart, labs and discussed the procedure including the risks, benefits and alternatives for the proposed anesthesia with the patient or authorized representative who has indicated his/her understanding and acceptance.   Dental Advisory Given  Plan Discussed with: CRNA and Anesthesiologist  Anesthesia Plan Comments:        Anesthesia Quick Evaluation

## 2018-02-11 NOTE — Anesthesia Postprocedure Evaluation (Signed)
Anesthesia Post Note  Patient: Timothy Doyle  Procedure(s) Performed: KNEE ARTHROSCOPY WITH PARTIAL MEDIAL MENISECTOMY   patellofemoral synovectomy.  (Left )  Patient location during evaluation: PACU Anesthesia Type: General Level of consciousness: awake and alert Pain management: pain level controlled Vital Signs Assessment: post-procedure vital signs reviewed and stable Respiratory status: spontaneous breathing, nonlabored ventilation, respiratory function stable and patient connected to nasal cannula oxygen Cardiovascular status: blood pressure returned to baseline and stable Postop Assessment: no apparent nausea or vomiting Anesthetic complications: no    Trecia Rogers

## 2018-02-11 NOTE — Anesthesia Procedure Notes (Signed)
Procedure Name: LMA Insertion Date/Time: 02/11/2018 2:35 PM Performed by: Cameron Ali, CRNA Pre-anesthesia Checklist: Patient identified, Emergency Drugs available, Suction available, Timeout performed and Patient being monitored Patient Re-evaluated:Patient Re-evaluated prior to induction Oxygen Delivery Method: Circle system utilized Preoxygenation: Pre-oxygenation with 100% oxygen Induction Type: IV induction LMA: LMA inserted LMA Size: 4.0 Number of attempts: 1 Placement Confirmation: positive ETCO2 and breath sounds checked- equal and bilateral Tube secured with: Tape Dental Injury: Teeth and Oropharynx as per pre-operative assessment

## 2018-02-11 NOTE — Op Note (Signed)
Operative Note    SURGERY DATE: 02/11/2018   PRE-OP DIAGNOSIS:  1. Left medial meniscus tear 2. Left patellofemoral synovitis   POST-OP DIAGNOSIS:  1. Left medial meniscus tear 2. Left patellofemoral synovitis   PROCEDURES:  1. Left knee arthroscopy, partial medial meniscectomy 2. Left knee patellofemoral compartment synovectomy   SURGEON: Cato Mulligan, MD   ANESTHESIA: Gen   ESTIMATED BLOOD LOSS: minimal   TOTAL IV FLUIDS: per anesthesia   INDICATION(S):  Timothy Doyle is a 52 y.o. male with signs and symptoms as well as MRI finding of medial meniscus tear as well as synovitis of the patellofemoral compartment with MRI appearance of PVNS. After discussion of risks, benefits, and alternatives to surgery, the patient elected to proceed.   OPERATIVE FINDINGS:    Examination under anesthesia: A careful examination under anesthesia was performed.  Passive range of motion was: Hyperextension: 2.  Extension: 0.  Flexion: 125.  Lachman: normal. Pivot Shift: normal.  Posterior drawer: normal.  Varus stability in full extension: normal.  Varus stability in 30 degrees of flexion: normal.  Valgus stability in full extension: normal.  Valgus stability in 30 degrees of flexion: normal.   Intra-operative findings: A thorough arthroscopic examination of the knee was performed.  The findings are: 1. Suprapatellar pouch: Significant synovitis and decreased volume in the suprapatellar pouch; cystic and inflamed appearance of synovial tissue 2. Undersurface of median ridge: Grade 1-2 degenerative changes 3. Medial patellar facet: Grade 1-2 degenerative changes 4. Lateral patellar facet:Grade 1-2 degenerative changes 5. Trochlea: Grade 1 degenerative changes 6. Lateral gutter/popliteus tendon: Normal 7. Hoffa's fat pad: Inflamed/synovitic 8. Medial gutter/plica: Normal 9. ACL: Normal 10. PCL: Normal 11. Medial meniscus: Complex tear with both radial/parrot beak component of the posterior horn  and longitudinal tear near the periphery of the posterior horn that affected ~80% of the meniscus width 12. Medial compartment cartilage: Grade 1-2 degenerative changes 13. Lateral meniscus: Normal 14. Lateral compartment cartilage: Normal   OPERATIVE REPORT:     I identified Timothy Doyle in the pre-operative holding area. I marked the operative knee with my initials. I reviewed the risks and benefits of the proposed surgical intervention and the patient (and/or patient's guardian) wished to proceed. The patient was transferred to the operative suite and placed in the supine position with all bony prominences padded.  Anesthesia was administered. Appropriate IV antibiotics were administered prior to incision. The extremity was then prepped and draped in standard fashion. A time out was performed confirming the correct extremity, correct patient, and correct procedure.   Arthroscopy portals were marked. Local anesthetic was injected to the planned portal sites. The anterolateral portal was established with an 11 blade.      The arthroscope was placed in the anterolateral portal and then into the suprapatellar pouch.  A diagnostic knee scope was completed with the above findings. The medial meniscus tear was identified.   Next the medial portal was established under needle localization. The MCL was pie-crusted to improve visualization of the posterior horn. The meniscal tear was debrided using an arthroscopic biter and an oscillating shaver until the meniscus had stable borders.    Next, samples of the cystic appearing synovial tissue in the patellofemoral compartment and suprapatellar pouch were obtained with a pituitary grasper.  These were sent for pathology.  Then, a combination of a shaver, grasper, and ArthroCare wand was used to perform a synovectomy of the patellofemoral compartment.  Afterwards, normal suprapatellar pouch volume was restored and all cystic-appearing and  synovitic appearing  tissue was removed.  Arthroscopic fluid was then removed from the joint.  The portals were closed with 3-0 Nylon suture. Sterile dressings included Xeroform, 4x4s, Sof-Rol, and Bias wrap. A Polarcare was placed.  The patient was then awakened and taken to the PACU hemodynamically stable without complication.     POSTOPERATIVE PLAN: The patient will be discharged home today once they meet PACU criteria. Aspirin 325 mg daily was prescribed for 2 weeks for DVT prophylaxis.  Physical therapy will start on POD#3-4. Weight-bearing as tolerated. Follow up in 2 weeks per protocol.

## 2018-02-11 NOTE — H&P (Signed)
Paper H&P to be scanned into permanent record. H&P reviewed. No significant changes noted.  

## 2018-02-11 NOTE — Transfer of Care (Signed)
Immediate Anesthesia Transfer of Care Note  Patient: Shia Eber  Procedure(s) Performed: KNEE ARTHROSCOPY WITH PARTIAL MEDIAL MENISECTOMY    (Left )  Patient Location: PACU  Anesthesia Type: General LMA  Level of Consciousness: awake, alert  and patient cooperative  Airway and Oxygen Therapy: Patient Spontanous Breathing and Patient connected to supplemental oxygen  Post-op Assessment: Post-op Vital signs reviewed, Patient's Cardiovascular Status Stable, Respiratory Function Stable, Patent Airway and No signs of Nausea or vomiting  Post-op Vital Signs: Reviewed and stable  Complications: No apparent anesthesia complications

## 2018-02-11 NOTE — Discharge Instructions (Signed)
Arthroscopic Knee Surgery - Partial Meniscectomy °  °Post-Op Instructions °  °1. Bracing or crutches: Crutches will be provided at the time of discharge from the surgery center if you do not already have them. °  °2. Ice: You may be provided with a device (Polar Care) that allows you to ice the affected area effectively. Otherwise you can ice manually.  °  °3. Driving:  Plan on not driving for at least two weeks. Please note that you are advised NOT to drive while taking narcotic pain medications as you may be impaired and unsafe to drive. °  °4. Activity: Ankle pumps several times an hour while awake to prevent blood clots. Weight bearing: as tolerated. Use crutches for as needed (usually ~1 week or less) until pain allows you to ambulate without a limp. Bending and straightening the knee is unlimited. Elevate knee above heart level as much as possible for one week. Avoid standing more than 5 minutes (consecutively) for the first week.  Avoid long distance travel for 2 weeks. ° °5. Medications:  °- You have been provided a prescription for narcotic pain medicine. After surgery, take 1-2 narcotic tablets every 4 hours if needed for severe pain.  °- You may take up to 3000mg/day of tylenol (acetaminophen). You can take 1000mg 3x/day. Please check your narcotic. If you have acetaminophen in your narcotic (each tablet will be 325mg), be careful not to exceed a total of 3000mg/day of acetaminophen.  °- A prescription for anti-nausea medication will be provided in case the narcotic medicine or anesthesia causes nausea - take 1 tablet every 6 hours only if nauseated.  °- Take ibuprofen 800 mg every 8 hours WITH food to reduce post-operative knee swelling. DO NOT STOP IBUPROFEN POST-OP UNTIL INSTRUCTED TO DO SO at first post-op office visit (10-14 days after surgery). However, please discontinue if you have any abdominal discomfort after taking this.  °- Take enteric coated aspirin 325 mg once daily for 2 weeks to prevent  blood clots.  ° ° °6. Bandages: The physical therapist should change the bandages at the first post-op appointment. If needed, the dressing supplies have been provided to you. °  °7. Physical Therapy: 1-2 times per week for 6 weeks. Therapy typically starts on post operative Day 3 or 4. You have been provided an order for physical therapy. The therapist will provide home exercises. °  °8. Work: May return to full work usually around 2 weeks after 1st post-operative visit. May do light duty/desk job in approximately 1-2 weeks when off of narcotics, pain is well-controlled, and swelling has decreased. Labor intensive jobs may require 4-6 weeks to return.  °  °  °9. Post-Op Appointments: °Your first post-op appointment will be with Dr. Clavin Ruhlman in approximately 2 weeks time.  °  °If you find that they have not been scheduled please call the Orthopaedic Appointment front desk at [phone]. ° ° ° ° °General Anesthesia, Adult, Care After °This sheet gives you information about how to care for yourself after your procedure. Your health care provider may also give you more specific instructions. If you have problems or questions, contact your health care provider. °What can I expect after the procedure? °After the procedure, the following side effects are common: °· Pain or discomfort at the IV site. °· Nausea. °· Vomiting. °· Sore throat. °· Trouble concentrating. °· Feeling cold or chills. °· Weak or tired. °· Sleepiness and fatigue. °· Soreness and body aches. These side effects can affect parts   of the body that were not involved in surgery. °Follow these instructions at home: ° °For at least 24 hours after the procedure: °· Have a responsible adult stay with you. It is important to have someone help care for you until you are awake and alert. °· Rest as needed. °· Do not: °? Participate in activities in which you could fall or become injured. °? Drive. °? Use heavy machinery. °? Drink alcohol. °? Take sleeping pills or  medicines that cause drowsiness. °? Make important decisions or sign legal documents. °? Take care of children on your own. °Eating and drinking °· Follow any instructions from your health care provider about eating or drinking restrictions. °· When you feel hungry, start by eating small amounts of foods that are soft and easy to digest (bland), such as toast. Gradually return to your regular diet. °· Drink enough fluid to keep your urine pale yellow. °· If you vomit, rehydrate by drinking water, juice, or clear broth. °General instructions °· If you have sleep apnea, surgery and certain medicines can increase your risk for breathing problems. Follow instructions from your health care provider about wearing your sleep device: °? Anytime you are sleeping, including during daytime naps. °? While taking prescription pain medicines, sleeping medicines, or medicines that make you drowsy. °· Return to your normal activities as told by your health care provider. Ask your health care provider what activities are safe for you. °· Take over-the-counter and prescription medicines only as told by your health care provider. °· If you smoke, do not smoke without supervision. °· Keep all follow-up visits as told by your health care provider. This is important. °Contact a health care provider if: °· You have nausea or vomiting that does not get better with medicine. °· You cannot eat or drink without vomiting. °· You have pain that does not get better with medicine. °· You are unable to pass urine. °· You develop a skin rash. °· You have a fever. °· You have redness around your IV site that gets worse. °Get help right away if: °· You have difficulty breathing. °· You have chest pain. °· You have blood in your urine or stool, or you vomit blood. °Summary °· After the procedure, it is common to have a sore throat or nausea. It is also common to feel tired. °· Have a responsible adult stay with you for the first 24 hours after general  anesthesia. It is important to have someone help care for you until you are awake and alert. °· When you feel hungry, start by eating small amounts of foods that are soft and easy to digest (bland), such as toast. Gradually return to your regular diet. °· Drink enough fluid to keep your urine pale yellow. °· Return to your normal activities as told by your health care provider. Ask your health care provider what activities are safe for you. °This information is not intended to replace advice given to you by your health care provider. Make sure you discuss any questions you have with your health care provider. °Document Released: 05/12/2000 Document Revised: 09/19/2016 Document Reviewed: 09/19/2016 °Elsevier Interactive Patient Education © 2019 [employer] ° °

## 2018-02-12 ENCOUNTER — Encounter: Payer: Self-pay | Admitting: Orthopedic Surgery

## 2018-02-15 LAB — SURGICAL PATHOLOGY

## 2018-11-24 ENCOUNTER — Inpatient Hospital Stay
Admission: EM | Admit: 2018-11-24 | Discharge: 2018-11-26 | DRG: 310 | Disposition: A | Discharge status: Home / Self Care | Payer: BC Managed Care – PPO | Attending: Internal Medicine | Admitting: Internal Medicine

## 2018-11-24 DIAGNOSIS — Z87891 Personal history of nicotine dependence: Secondary | ICD-10-CM

## 2018-11-24 DIAGNOSIS — R0602 Shortness of breath: Secondary | ICD-10-CM

## 2018-11-24 DIAGNOSIS — M1990 Unspecified osteoarthritis, unspecified site: Secondary | ICD-10-CM | POA: Diagnosis present

## 2018-11-24 DIAGNOSIS — Z9119 Patient's noncompliance with other medical treatment and regimen: Secondary | ICD-10-CM | POA: Diagnosis not present

## 2018-11-24 DIAGNOSIS — I10 Essential (primary) hypertension: Secondary | ICD-10-CM | POA: Diagnosis present

## 2018-11-24 DIAGNOSIS — L4050 Arthropathic psoriasis, unspecified: Secondary | ICD-10-CM | POA: Diagnosis present

## 2018-11-24 DIAGNOSIS — I4891 Unspecified atrial fibrillation: Principal | ICD-10-CM | POA: Diagnosis present

## 2018-11-24 DIAGNOSIS — G4733 Obstructive sleep apnea (adult) (pediatric): Secondary | ICD-10-CM | POA: Diagnosis present

## 2018-11-24 DIAGNOSIS — Z20828 Contact with and (suspected) exposure to other viral communicable diseases: Secondary | ICD-10-CM | POA: Diagnosis present

## 2018-11-24 DIAGNOSIS — I361 Nonrheumatic tricuspid (valve) insufficiency: Secondary | ICD-10-CM | POA: Diagnosis not present

## 2018-11-24 LAB — CBC
HCT: 42.4 % (ref 39.0–52.0)
Hemoglobin: 15.2 g/dL (ref 13.0–17.0)
MCH: 30.3 pg (ref 26.0–34.0)
MCHC: 35.8 g/dL (ref 30.0–36.0)
MCV: 84.6 fL (ref 80.0–100.0)
Platelets: 233 10*3/uL (ref 150–400)
RBC: 5.01 MIL/uL (ref 4.22–5.81)
RDW: 12.1 % (ref 11.5–15.5)
WBC: 9.1 10*3/uL (ref 4.0–10.5)
nRBC: 0 % (ref 0.0–0.2)

## 2018-11-24 LAB — COMPREHENSIVE METABOLIC PANEL
ALT: 24 U/L (ref 0–44)
AST: 17 U/L (ref 15–41)
Albumin: 4.9 g/dL (ref 3.5–5.0)
Alkaline Phosphatase: 58 U/L (ref 38–126)
Anion gap: 9 (ref 5–15)
BUN: 19 mg/dL (ref 6–20)
CO2: 28 mmol/L (ref 22–32)
Calcium: 9.8 mg/dL (ref 8.9–10.3)
Chloride: 104 mmol/L (ref 98–111)
Creatinine, Ser: 0.87 mg/dL (ref 0.61–1.24)
GFR calc Af Amer: >60 mL/min (ref >60)
GFR calc non Af Amer: >60 mL/min (ref >60)
Glucose, Bld: 138 mg/dL — ABNORMAL HIGH (ref 70–99)
Potassium: 3.5 mmol/L (ref 3.5–5.1)
Sodium: 141 mmol/L (ref 135–145)
Total Bilirubin: 0.7 mg/dL (ref 0.3–1.2)
Total Protein: 8.2 g/dL — ABNORMAL HIGH (ref 6.5–8.1)

## 2018-11-24 LAB — TROPONIN I (HIGH SENSITIVITY): Troponin I (High Sensitivity): 5 ng/L (ref <18)

## 2018-11-24 LAB — SARS CORONAVIRUS 2 BY RT PCR (HOSPITAL ORDER, PERFORMED IN ~~LOC~~ HOSPITAL LAB): SARS Coronavirus 2: NEGATIVE

## 2018-11-24 MED ORDER — HYDROCHLOROTHIAZIDE 25 MG PO TABS
25.0000 mg | ORAL_TABLET | Freq: Every day | ORAL | Status: DC
  Filled 2018-11-24: qty 1

## 2018-11-24 MED ORDER — APREMILAST 30 MG PO TABS: 30.0000 mg | ORAL_TABLET | Freq: Two times a day (BID) | ORAL | Status: DC

## 2018-11-24 MED ORDER — DILTIAZEM HCL 25 MG/5ML IV SOLN
INTRAVENOUS | Status: AC
  Filled 2018-11-24: qty 5

## 2018-11-24 MED ORDER — ADULT MULTIVITAMIN W/MINERALS CH
1.0000 | ORAL_TABLET | Freq: Every day | ORAL | Status: DC
  Administered 2018-11-25: 21:00:00 1 via ORAL
  Filled 2018-11-24: qty 1

## 2018-11-24 MED ORDER — HYDROCODONE-ACETAMINOPHEN 5-325 MG PO TABS: 1.0000 | ORAL_TABLET | ORAL | Status: DC | PRN

## 2018-11-24 MED ORDER — DILTIAZEM HCL 25 MG/5ML IV SOLN
10.0000 mg | Freq: Once | INTRAVENOUS | Status: AC
  Administered 2018-11-24: 21:00:00 10 mg via INTRAVENOUS

## 2018-11-24 MED ORDER — MELATONIN 5 MG PO TABS
5.0000 mg | ORAL_TABLET | Freq: Every day | ORAL | Status: DC
  Administered 2018-11-25: 22:00:00 5 mg via ORAL
  Filled 2018-11-24 (×2): qty 1

## 2018-11-24 MED ORDER — ACETAMINOPHEN 325 MG PO TABS
650.0000 mg | ORAL_TABLET | ORAL | Status: DC | PRN
  Administered 2018-11-25 – 2018-11-26 (×4): 650 mg via ORAL
  Filled 2018-11-24 (×4): qty 2

## 2018-11-24 MED ORDER — DILTIAZEM HCL 100 MG IV SOLR
5.0000 mg/h | INTRAVENOUS | Status: DC
  Administered 2018-11-24: 22:00:00 5 mg/h via INTRAVENOUS
  Administered 2018-11-25: 10 mg/h via INTRAVENOUS
  Filled 2018-11-24 (×2): qty 100

## 2018-11-24 MED ORDER — SODIUM CHLORIDE 0.9 % IV BOLUS
1000.0000 mL | Freq: Once | INTRAVENOUS | Status: AC
  Administered 2018-11-24: 1000 mL via INTRAVENOUS

## 2018-11-24 MED ORDER — DILTIAZEM HCL 100 MG IV SOLR: 10.0000 mg/h | INTRAVENOUS | Status: DC

## 2018-11-24 MED ORDER — ENOXAPARIN SODIUM 40 MG/0.4ML ~~LOC~~ SOLN
40.0000 mg | SUBCUTANEOUS | Status: DC
  Administered 2018-11-25: 40 mg via SUBCUTANEOUS
  Filled 2018-11-24: qty 0.4

## 2018-11-24 MED ORDER — ONDANSETRON HCL 4 MG/2ML IJ SOLN
4.0000 mg | Freq: Four times a day (QID) | INTRAMUSCULAR | Status: DC | PRN
  Administered 2018-11-24: 4 mg via INTRAVENOUS

## 2018-11-24 MED ORDER — LISINOPRIL 20 MG PO TABS
40.0000 mg | ORAL_TABLET | ORAL | Status: DC
  Administered 2018-11-26: 40 mg via ORAL
  Filled 2018-11-24 (×2): qty 2

## 2018-11-24 MED ORDER — ONDANSETRON HCL 4 MG/2ML IJ SOLN
INTRAMUSCULAR | Status: AC
  Filled 2018-11-24: qty 2

## 2018-11-24 NOTE — H&P (Signed)
[facility] [place] NAME: Timothy Doyle    MR#:  [id number]  DATE OF BIRTH:  20-Oct-1965  DATE OF ADMISSION:  11/24/2018  PRIMARY CARE PHYSICIAN: Tracie Harrier, MD   REQUESTING/REFERRING PHYSICIAN: Harvest Dark,  CHIEF COMPLAINT:   Chief Complaint  Patient presents with  . Chest Pain  . Shortness of Breath    HISTORY OF PRESENT ILLNESS:  53 y.o. male with pertinent past medical history of obstructive sleep apnea on CPAP, hypertension, and psoriatic arthritis presenting to the ED with chief complaints of palpitations, chest pressure and shortness of breath.  Patient report onset of symptoms since 1700 today, describes symptoms of sudden onset shortness of breath, chest pressure without pain, and palpitations as if he has been running a marathon. Endorses associated symptoms of dizziness, sore throat, nausea without vomiting. Denies associated symptoms of vomiting, diaphoresis, cough, fevers or chills, headache, or any recent sick contacts.  Denies illicit drug use or alcohol intake.  On arrival to the ED, he was afebrile with blood pressure 134/91 mm Hg and pulse rate 177 beats/min. There were no focal neurological deficits; he was alert and oriented x4, and he did not demonstrate any memory deficits.  ECG showed atrial fibrillation with rapid ventricular response at 177 bpm with a narrow QRS.  Patient was loaded with IV diltiazem but remains in A. fib with RVR therefore started on delta infusion.  Initial labs revealed unremarkable CBC, CMP and troponin.  Hospitalist asked to admit for further management.  PAST MEDICAL HISTORY:   Past Medical History:  Diagnosis Date  . Arthritis    psoriatic  . Hypertension   . Sleep apnea    USES CPAP    PAST SURGICAL HISTORY:   Past Surgical History:  Procedure Laterality Date  . APPENDECTOMY  1989  . KNEE ARTHROSCOPY  2006  . KNEE ARTHROSCOPY WITH MEDIAL MENISECTOMY Right 10/26/2017    Procedure: KNEE ARTHROSCOPY WITH MEDIAL MENISECTOMY;  Surgeon: Leim Fabry, MD;  Location: [facility];  Service: Orthopedics;  Laterality: Right;  . KNEE ARTHROSCOPY WITH MEDIAL MENISECTOMY Left 02/11/2018   Procedure: KNEE ARTHROSCOPY WITH PARTIAL MEDIAL MENISECTOMY   patellofemoral synovectomy. ;  Surgeon: Leim Fabry, MD;  Location: [facility];  Service: Orthopedics;  Laterality: Left;  sleep apnea  . ROTATOR CUFF REPAIR Right     SOCIAL HISTORY:   Social History   Tobacco Use  . Smoking status: Former Smoker    Packs/day: 1.00    Years: 12.00    Pack years: 12.00    Types: Cigarettes    Quit date: 2001    Years since quitting: 19.7  . Smokeless tobacco: Never Used  Substance Use Topics  . Alcohol use: Yes    Comment: OCC-3 DRINKS PER WEEK    FAMILY HISTORY:  No family history on file.  DRUG ALLERGIES:  No Known Allergies  REVIEW OF SYSTEMS:   Review of Systems  Constitutional: Negative for chills, fever, malaise/fatigue and weight loss.  HENT: Positive for sore throat. Negative for congestion and hearing loss.   Eyes: Negative for blurred vision and double vision.  Respiratory: Positive for shortness of breath. Negative for cough and wheezing.   Cardiovascular: Positive for palpitations. Negative for chest pain, orthopnea and leg swelling.       Chest pressure  Gastrointestinal: Negative for abdominal pain, diarrhea, nausea and vomiting.  Genitourinary: Negative for dysuria and urgency.  Musculoskeletal: Negative for myalgias.  Skin: Negative for rash.  Neurological: Positive for dizziness. Negative for sensory change, speech change, focal weakness and headaches.  Psychiatric/Behavioral: Negative for depression.   MEDICATIONS AT HOME:   Prior to Admission medications   Medication Sig Start Date End Date Taking? Authorizing Provider  acetaminophen (TYLENOL) 500 MG tablet Take 2 tablets (1,000 mg total) by mouth every 8 (eight) hours. 02/11/18 02/11/19   Leim Fabry, MD  Apremilast (OTEZLA) 30 MG TABS Take 30 mg by mouth 2 (two) times daily.    [provider]  hydrochlorothiazide (HYDRODIURIL) 25 MG tablet Take 25 mg by mouth daily. 08/07/17   [provider]  HYDROcodone-acetaminophen (NORCO) 5-325 MG tablet Take 1-2 tablets by mouth every 4 (four) hours as needed for moderate pain or severe pain. 02/11/18   Leim Fabry, MD  lisinopril (PRINIVIL,ZESTRIL) 40 MG tablet Take 40 mg by mouth every morning.  08/07/17   [provider]  Melatonin 5 MG TABS Take 5 mg by mouth at bedtime.    [provider]  Multiple Vitamin (MULTIVITAMIN WITH MINERALS) TABS tablet Take 1 tablet by mouth at bedtime.    [provider]  ondansetron (ZOFRAN ODT) 4 MG disintegrating tablet Take 1 tablet (4 mg total) by mouth every 8 (eight) hours as needed for nausea or vomiting. Patient not taking: Reported on 02/02/2018 10/26/17   Leim Fabry, MD  ondansetron (ZOFRAN ODT) 4 MG disintegrating tablet Take 1 tablet (4 mg total) by mouth every 8 (eight) hours as needed for nausea or vomiting. 02/11/18   Leim Fabry, MD      VITAL SIGNS:  Blood pressure (!) 136/95, pulse 85, temperature 97.8 F (36.6 C), temperature source Oral, resp. rate 17, SpO2 97 %.  PHYSICAL EXAMINATION:   Physical Exam  GENERAL:  53 y.o.-year-old patient lying in the bed with no acute distress.  EYES: Pupils equal, round, reactive to light and accommodation. No scleral icterus. Extraocular muscles intact.  HEENT: Head atraumatic, normocephalic. Oropharynx and nasopharynx clear.  NECK:  Supple, no jugular venous distention. No thyroid enlargement, no tenderness.  LUNGS: Normal breath sounds bilaterally, no wheezing, rales,rhonchi or crepitation. No use of accessory muscles of respiration.  CARDIOVASCULAR: S1, S2 normal. No murmurs, rubs, or gallops.  ABDOMEN: Soft, nontender, nondistended. Bowel sounds present. No organomegaly or mass.  EXTREMITIES:  No pedal edema, cyanosis, or clubbing.  NEUROLOGIC: Cranial nerves II through XII are intact. Muscle strength 5/5 in all extremities. Sensation intact. Gait not checked.  PSYCHIATRIC: The patient is alert and oriented x 3.  SKIN: No obvious rash, lesion, or ulcer.   DATA REVIEWED:  LABORATORY PANEL:   CBC Recent Labs  Lab 11/24/18 2058  WBC 9.1  HGB 15.2  HCT 42.4  PLT 233   ------------------------------------------------------------------------------------------------------------------  Chemistries  Recent Labs  Lab 11/24/18 2058  NA 141  K 3.5  CL 104  CO2 28  GLUCOSE 138*  BUN 19  CREATININE 0.87  CALCIUM 9.8  AST 17  ALT 24  ALKPHOS 58  BILITOT 0.7   ------------------------------------------------------------------------------------------------------------------  Cardiac Enzymes No results for input(s): TROPONINI in the last 168 hours. ------------------------------------------------------------------------------------------------------------------  RADIOLOGY:  No results found.  EKG:  EKG: unchanged from previous tracings, atrial fibrillation, rate 177 bmp.  IMPRESSION AND PLAN:   53 y.o. male with pertinent past medical history of obstructive sleep apnea on CPAP, hypertension, and psoriatic arthritis presenting to the ED with chief complaints of palpitations, chest pressure and shortness of breath  1. New onset Atrial Fibrillation with RVR - Admit to telemetry  unit - Troponins - TSH, FT4 - EtOH, UTox - CXR - TTEcho - Diltiazem 0.25mg /kg (15mg ) IV x1 given in the ED will continue with maintenance dose of 5-15mg /hr drip - Thromboembolism Risk Management Lovenox for now pending cardiology input for possible anticoagulation - Cardiology Consultation placed to Dr. Ubaldo Glassing. Message sent via [facility]  2. Obstructive sleep apnea - We will order CPAP for nighttime use  3. Hypertension - Continue lisinopril/hydrochlorothiazide for now as blood pressure  slightly elevated  4. DVT prophylaxis - Enoxaparin SubQ   All the records are reviewed and case discussed with ED provider. Management plans discussed with the patient, family and they are in agreement.  CODE STATUS: FULL  TOTAL TIME TAKING CARE OF THIS PATIENT: 50 minutes.    on 11/24/2018 at 10:46 PM  Rufina Falco, DNP, FNP-BC [facility] Nurse Practitioner Between 7am to 6pm - Pager [phone]  After 6pm go to [email or web address] - password [email or web address]  Office  [phone]  CC: Primary care physician; Tracie Harrier, MD

## 2018-11-24 NOTE — ED Triage Notes (Signed)
Pt presents to ED with sudden onset of sob and chest pressure since 1700. Denies hx of the same. Pt states he feels like he has been running and feels like he is having palpitations. Headache and body aches for the past few days.

## 2018-11-24 NOTE — ED Provider Notes (Signed)
[facility] Emergency Department Provider Note  Time seen: 9:00 PM  I have reviewed the triage vital signs and the nursing notes.   HISTORY  Chief Complaint Chest Pain and Shortness of Breath   HPI Timothy Doyle is a 53 y.o. male with a past medical history of hypertension, arthritis, presents emergency department for heart racing and chest pressure.  According to the patient ran 6 PM tonight after work he was sitting on the couch watching TV when he began feeling his heart racing and felt like he could not catch his breath.  Patient denies any history of the same.  Denies any "chest pain."  Patient does state mild cough and sore throat over the last couple days but denies any known fever.  No vomiting or diarrhea.   Past Medical History:  Diagnosis Date  . Arthritis    psoriatic  . Hypertension   . Sleep apnea    USES CPAP    There are no active problems to display for this patient.   Past Surgical History:  Procedure Laterality Date  . APPENDECTOMY  1989  . KNEE ARTHROSCOPY  2006  . KNEE ARTHROSCOPY WITH MEDIAL MENISECTOMY Right 10/26/2017   Procedure: KNEE ARTHROSCOPY WITH MEDIAL MENISECTOMY;  Surgeon: Leim Fabry, MD;  Location: [facility] ORS;  Service: Orthopedics;  Laterality: Right;  . KNEE ARTHROSCOPY WITH MEDIAL MENISECTOMY Left 02/11/2018   Procedure: KNEE ARTHROSCOPY WITH PARTIAL MEDIAL MENISECTOMY   patellofemoral synovectomy. ;  Surgeon: Leim Fabry, MD;  Location: [place];  Service: Orthopedics;  Laterality: Left;  sleep apnea  . ROTATOR CUFF REPAIR Right     Prior to Admission medications   Medication Sig Start Date End Date Taking? Authorizing Provider  acetaminophen (TYLENOL) 500 MG tablet Take 2 tablets (1,000 mg total) by mouth every 8 (eight) hours. 02/11/18 02/11/19  Leim Fabry, MD  Apremilast (OTEZLA) 30 MG TABS Take 30 mg by mouth 2 (two) times daily.    [provider]  hydrochlorothiazide (HYDRODIURIL) 25 MG  tablet Take 25 mg by mouth daily. 08/07/17   [provider]  HYDROcodone-acetaminophen (NORCO) 5-325 MG tablet Take 1-2 tablets by mouth every 4 (four) hours as needed for moderate pain or severe pain. 02/11/18   Leim Fabry, MD  lisinopril (PRINIVIL,ZESTRIL) 40 MG tablet Take 40 mg by mouth every morning.  08/07/17   [provider]  Melatonin 5 MG TABS Take 5 mg by mouth at bedtime.    [provider]  Multiple Vitamin (MULTIVITAMIN WITH MINERALS) TABS tablet Take 1 tablet by mouth at bedtime.    [provider]  ondansetron (ZOFRAN ODT) 4 MG disintegrating tablet Take 1 tablet (4 mg total) by mouth every 8 (eight) hours as needed for nausea or vomiting. Patient not taking: Reported on 02/02/2018 10/26/17   Leim Fabry, MD  ondansetron (ZOFRAN ODT) 4 MG disintegrating tablet Take 1 tablet (4 mg total) by mouth every 8 (eight) hours as needed for nausea or vomiting. 02/11/18   Leim Fabry, MD    No Known Allergies  No family history on file.  Social History Social History   Tobacco Use  . Smoking status: Former Smoker    Packs/day: 1.00    Years: 12.00    Pack years: 12.00    Types: Cigarettes    Quit date: 2001    Years since quitting: 19.7  . Smokeless tobacco: Never Used  Substance Use Topics  . Alcohol use: Yes    Comment: OCC-3 DRINKS  PER WEEK  . Drug use: Never    Review of Systems Constitutional: Negative for fever. Cardiovascular: Positive for chest pressure since 6 PM tonight. Respiratory: Mild shortness of breath.  Mild cough. Gastrointestinal: Negative for abdominal pain Musculoskeletal: Negative for musculoskeletal complaints Neurological: Negative for headache All other ROS negative  ____________________________________________   PHYSICAL EXAM:  VITAL SIGNS: ED Triage Vitals [11/24/18 2056]  Enc Vitals Group     BP (!) 134/91     Pulse Rate (!) 177     Resp (!) 22     Temp 97.8 F (36.6 C)     Temp Source Oral      SpO2 94 %     Weight      Height      Head Circumference      Peak Flow      Pain Score      Pain Loc      Pain Edu?      Excl. in [place]?     Constitutional: Alert and oriented. Well appearing and in no distress. Eyes: Normal exam ENT      Head: Normocephalic and atraumatic.      Mouth/Throat: Mucous membranes are moist. Cardiovascular: Irregular rhythm rate around 150 to 160 bpm. Respiratory: Normal respiratory effort without tachypnea nor retractions. Breath sounds are clear Gastrointestinal: Soft and nontender. No distention. Musculoskeletal: Nontender with normal range of motion in all extremities.  Neurologic:  Normal speech and language. No gross focal neurologic deficits  Skin:  Skin is warm, dry and intact.  Psychiatric: Mood and affect are normal.  ____________________________________________    EKG  EKG viewed and interpreted by myself shows atrial fibrillation with rapid ventricular response at 177 bpm with a narrow QRS, normal axis, largely normal intervals with nonspecific ST changes.  ____________________________________________   INITIAL IMPRESSION / ASSESSMENT AND PLAN / ED COURSE  Pertinent labs & imaging results that were available during my care of the patient were reviewed by me and considered in my medical decision making (see chart for details).   Patient presents emergency department for rapid heart rate feeling of shortness of breath.  Patient's EKG is consistent with atrial fibrillation with rapid ventricular response around 177 bpm on EKG.  Patient denies any history of A. fib.  Denies any recent heavy alcohol use.  Does drink occasional alcohol.  Denies any "chest pain" but does state some chest tightness and pressure since the heart rate began racing.  We will check labs, dose IV diltiazem and fluids and continue to closely monitor.  Patient agreeable to plan of care.  Patient remains in A. fib with RVR around 120 to 130 bpm despite 2 rounds of IV  diltiazem.  Will start on a diltiazem infusion.  Patient will require admission to the hospital service once remainder of his lab work has resulted.  Patient agreeable to plan of care.    Timothy Doyle was evaluated in Emergency Department on 11/24/2018 for the symptoms described in the history of present illness. He was evaluated in the context of the global COVID-19 pandemic, which necessitated consideration that the patient might be at risk for infection with the SARS-CoV-2 virus that causes COVID-19. Institutional protocols and algorithms that pertain to the evaluation of patients at risk for COVID-19 are in a state of rapid change based on information released by regulatory bodies including the CDC and federal and state organizations. These policies and algorithms were followed during the patient's care in the ED.  CRITICAL CARE  Performed by: Harvest Dark   Total critical care time: 30 minutes  Critical care time was exclusive of separately billable procedures and treating other patients.  Critical care was necessary to treat or prevent imminent or life-threatening deterioration.  Critical care was time spent personally by me on the following activities: development of treatment plan with patient and/or surrogate as well as nursing, discussions with consultants, evaluation of patient's response to treatment, examination of patient, obtaining history from patient or surrogate, ordering and performing treatments and interventions, ordering and review of laboratory studies, ordering and review of radiographic studies, pulse oximetry and re-evaluation of patient's condition.  ____________________________________________   FINAL CLINICAL IMPRESSION(S) / ED DIAGNOSES  New onset atrial fibrillation Atrial fibrillation with rapid ventricular response   Harvest Dark, MD 11/24/18 2148

## 2018-11-25 ENCOUNTER — Other Ambulatory Visit: Payer: Self-pay

## 2018-11-25 ENCOUNTER — Inpatient Hospital Stay: Payer: BC Managed Care – PPO

## 2018-11-25 ENCOUNTER — Inpatient Hospital Stay (HOSPITAL_COMMUNITY)
Admit: 2018-11-25 | Discharge: 2018-11-25 | Disposition: A | Discharge status: Home / Self Care | Payer: BC Managed Care – PPO | Attending: Nurse Practitioner | Admitting: Nurse Practitioner

## 2018-11-25 DIAGNOSIS — I361 Nonrheumatic tricuspid (valve) insufficiency: Secondary | ICD-10-CM

## 2018-11-25 LAB — HEMOGLOBIN A1C
Hgb A1c MFr Bld: 4.9 % (ref 4.8–5.6)
Mean Plasma Glucose: 93.93 mg/dL

## 2018-11-25 LAB — LIPID PANEL
Cholesterol: 159 mg/dL (ref 0–200)
HDL: 37 mg/dL — ABNORMAL LOW (ref >40)
LDL Cholesterol: 98 mg/dL (ref 0–99)
Total CHOL/HDL Ratio: 4.3 RATIO
Triglycerides: 122 mg/dL (ref <150)
VLDL: 24 mg/dL (ref 0–40)

## 2018-11-25 LAB — TSH: TSH: 4.886 u[IU]/mL — ABNORMAL HIGH (ref 0.350–4.500)

## 2018-11-25 LAB — ECHOCARDIOGRAM COMPLETE
Height: 70 in
Weight: 4250.47 oz

## 2018-11-25 LAB — TROPONIN I (HIGH SENSITIVITY): Troponin I (High Sensitivity): 11 ng/L (ref <18)

## 2018-11-25 LAB — GLUCOSE, CAPILLARY: Glucose-Capillary: 102 mg/dL — ABNORMAL HIGH (ref 70–99)

## 2018-11-25 LAB — HIV ANTIBODY (ROUTINE TESTING W REFLEX): HIV Screen 4th Generation wRfx: NONREACTIVE

## 2018-11-25 LAB — MRSA PCR SCREENING: MRSA by PCR: NEGATIVE

## 2018-11-25 MED ORDER — APIXABAN 5 MG PO TABS
5.0000 mg | ORAL_TABLET | Freq: Two times a day (BID) | ORAL | Status: DC
  Administered 2018-11-25 – 2018-11-26 (×2): 5 mg via ORAL
  Filled 2018-11-25 (×2): qty 1

## 2018-11-25 MED ORDER — DILTIAZEM HCL ER COATED BEADS 180 MG PO CP24
180.0000 mg | ORAL_CAPSULE | Freq: Every day | ORAL | Status: DC
  Administered 2018-11-25 – 2018-11-26 (×2): 180 mg via ORAL
  Filled 2018-11-25 (×2): qty 1

## 2018-11-25 MED ORDER — ENOXAPARIN SODIUM 120 MG/0.8ML ~~LOC~~ SOLN: 1.0000 mg/kg | SUBCUTANEOUS | Status: DC

## 2018-11-25 MED ORDER — ENOXAPARIN SODIUM 120 MG/0.8ML ~~LOC~~ SOLN: 1.0000 mg/kg | Freq: Two times a day (BID) | SUBCUTANEOUS | Status: DC

## 2018-11-25 MED ORDER — CHLORHEXIDINE GLUCONATE CLOTH 2 % EX PADS
6.0000 | MEDICATED_PAD | Freq: Every day | CUTANEOUS | Status: DC
  Administered 2018-11-25: 6 via TOPICAL

## 2018-11-25 MED ORDER — ENOXAPARIN SODIUM 80 MG/0.8ML ~~LOC~~ SOLN
80.0000 mg | Freq: Once | SUBCUTANEOUS | Status: AC
  Administered 2018-11-25: 80 mg via SUBCUTANEOUS
  Filled 2018-11-25: qty 0.8

## 2018-11-25 NOTE — ED Notes (Signed)
Pt given water at this time 

## 2018-11-25 NOTE — ED Notes (Signed)
Attempted to call report at this time 

## 2018-11-25 NOTE — Consult Note (Signed)
[facility] Cardiology  CARDIOLOGY CONSULT NOTE  Patient ID: Timothy Doyle MRN: [id number] DOB/AGE: 55-Mar-1967 55 y.o.  Admit date: 11/24/2018 Referring Physician Rufina Falco, NP Primary Physician [facility] Primary Cardiologist None per patient Reason for Consultation Atrial fibrillation with RVR  HPI: 54 year old gentleman referred for evaluation of new onset atrial fibrillation with RVR.  Patient has a history of hypertension and sleep apnea with several month history of noncompliance with CPAP.  The patient reports a recent history of feeling exertional dyspnea especially when climbing stairs.  Yesterday evening while sitting on the couch, the patient experienced acute onset of chest pressure with palpitations and shortness of breath.  When he checked his heart rate he noted that it was quite elevated, and decided to come to the ER for further evaluation.  He states that this has never happened to him before.  He denies a history of MI, stroke, heart failure, diabetes, hyperlipidemia, PE/DVT, or GI bleed.  ECG in the ER revealed atrial fibrillation at a rate of 177 bpm with nonspecific ST wave abnormality.  Chest x-ray was negative for acute cardiopulmonary process.  Admission labs notable for normal high-sensitivity troponin of 5, followed by 11, normal CBC, with TSH 4.886. He received IV diltiazem x 2 with marginal improvement of rate, and was started on diltiazem drip. The patient currently reports feeling much better.  He denies chest pain or shortness of breath.  He does have palpitations occasionally. He has a chads vasc score of 1 (HTN), and was started on Eliquis 5 mg BID.  Review of systems complete and found to be negative unless listed above     Past Medical History:  Diagnosis Date  . Arthritis    psoriatic  . Hypertension   . Sleep apnea    USES CPAP    Past Surgical History:  Procedure Laterality Date  . APPENDECTOMY  1989  . KNEE ARTHROSCOPY  2006  . KNEE ARTHROSCOPY WITH  MEDIAL MENISECTOMY Right 10/26/2017   Procedure: KNEE ARTHROSCOPY WITH MEDIAL MENISECTOMY;  Surgeon: Leim Fabry, MD;  Location: [facility] ORS;  Service: Orthopedics;  Laterality: Right;  . KNEE ARTHROSCOPY WITH MEDIAL MENISECTOMY Left 02/11/2018   Procedure: KNEE ARTHROSCOPY WITH PARTIAL MEDIAL MENISECTOMY   patellofemoral synovectomy. ;  Surgeon: Leim Fabry, MD;  Location: [place];  Service: Orthopedics;  Laterality: Left;  sleep apnea  . ROTATOR CUFF REPAIR Right     Medications Prior to Admission  Medication Sig Dispense Refill Last Dose  . Apremilast (OTEZLA) 30 MG TABS Take 30 mg by mouth 2 (two) times daily.   11/24/2018 at Unknown time  . hydrochlorothiazide (HYDRODIURIL) 25 MG tablet Take 25 mg by mouth daily.  2 11/24/2018 at Unknown time  . lisinopril (PRINIVIL,ZESTRIL) 40 MG tablet Take 40 mg by mouth every morning.   3 11/24/2018 at Unknown time  . Melatonin 5 MG TABS Take 5 mg by mouth at bedtime.   11/23/2018 at Unknown time   Social History   Socioeconomic History  . Marital status: Married    Spouse name: Not on file  . Number of children: Not on file  . Years of education: Not on file  . Highest education level: Not on file  Occupational History  . Not on file  Social Needs  . Financial resource strain: Not on file  . Food insecurity    Worry: Not on file    Inability: Not on file  . Transportation needs    Medical: Not on file  Non-medical: Not on file  Tobacco Use  . Smoking status: Former Smoker    Packs/day: 1.00    Years: 12.00    Pack years: 12.00    Types: Cigarettes    Quit date: 2001    Years since quitting: 19.7  . Smokeless tobacco: Never Used  Substance and Sexual Activity  . Alcohol use: Yes    Comment: OCC-3 DRINKS PER WEEK  . Drug use: Never  . Sexual activity: Not on file  Lifestyle  . Physical activity    Days per week: Not on file    Minutes per session: Not on file  . Stress: Not on file  Relationships  . Social Product manager on phone: Not on file    Gets together: Not on file    Attends religious service: Not on file    Active member of club or organization: Not on file    Attends meetings of clubs or organizations: Not on file    Relationship status: Not on file  . Intimate partner violence    Fear of current or ex partner: Not on file    Emotionally abused: Not on file    Physically abused: Not on file    Forced sexual activity: Not on file  Other Topics Concern  . Not on file  Social History Narrative  . Not on file    No family history on file.    Review of systems complete and found to be negative unless listed above      PHYSICAL EXAM  General: Well developed, well nourished, in no acute distress HEENT:  Normocephalic and atramatic Neck:  No JVD.  Lungs: Clear bilaterally to auscultation, normal effort of breathing on room air Heart: Irregularly irregular without gallops or murmurs.  Abdomen: Nondistended Msk:  Back normal, gait not assessed. Normal strength and tone for age. Extremities: No clubbing, cyanosis or edema.   Neuro: Alert and oriented X 3. Psych:  Good affect, responds appropriately  Labs:   Lab Results  Component Value Date   WBC 9.1 11/24/2018   HGB 15.2 11/24/2018   HCT 42.4 11/24/2018   MCV 84.6 11/24/2018   PLT 233 11/24/2018    Recent Labs  Lab 11/24/18 2058  NA 141  K 3.5  CL 104  CO2 28  BUN 19  CREATININE 0.87  CALCIUM 9.8  PROT 8.2*  BILITOT 0.7  ALKPHOS 58  ALT 24  AST 17  GLUCOSE 138*   No results found for: CKTOTAL, CKMB, CKMBINDEX, TROPONINI  Lab Results  Component Value Date   CHOL 159 11/25/2018   Lab Results  Component Value Date   HDL 37 (L) 11/25/2018   Lab Results  Component Value Date   LDLCALC 98 11/25/2018   Lab Results  Component Value Date   TRIG 122 11/25/2018   Lab Results  Component Value Date   CHOLHDL 4.3 11/25/2018   No results found for: LDLDIRECT    Radiology: Dg Chest 1 View  Result  Date: 11/25/2018 CLINICAL DATA:  Shortness of breath and chest pressure EXAM: CHEST  1 VIEW COMPARISON:  None. FINDINGS: The heart size and mediastinal contours are within normal limits. Both lungs are clear. The visualized skeletal structures are unremarkable. IMPRESSION: No acute cardiopulmonary process. Electronically Signed   By: Prudencio Pair M.D.   On: 11/25/2018 01:32    EKG: atrial fibrillation, rate 80s  ASSESSMENT AND PLAN:  1. New onset atrial fibrillation, presenting to the ER  in RVR, received IV diltiazem x 2, currently on diltiazem drip with rates in the 80s. Chads vasc score of 1. History of OSA with several month history of noncompliance of CPAP.  2. Hypertension, blood pressure initially soft this morning, on lisinopril and HCTZ at home.   Plan: 1. Discontinue Cardizem drip 2. Start oral Cardizem CD 180 mg  3. Hold lisinopril and HCTZ for now due recent lower blood pressure recordings. Consider resuming when blood pressure permits. 4. Continue Eliquis 5 mg BID for stroke prevention for now in anticipation of possible chemical or electrical cardioversion as outpatient. 5. Encouraged patient to use CPAP every night 6. Monitor TSH as outpatient as it was mildly elevated 7. Review 2D echocardiogram 8. Transfer to telemetry unit 9. Further recommendations pending patient's initial course and results of echocardiogram.    Signed: Clabe Seal PA-C 11/25/2018, 11:30 AM

## 2018-11-25 NOTE — ED Notes (Signed)
ED TO INPATIENT HANDOFF REPORT  ED Nurse Name and Phone #: gracie  S Name/Age/Gender Dirk Dress 53 y.o. male Room/Bed: ED05A/ED05A  Code Status   Code Status: Full Code  Home/SNF/Other Home Patient oriented to: self, place, time and situation Is this baseline? Yes   Triage Complete: Triage complete  Chief Complaint Shob/Chest pressure  Triage Note Pt presents to ED with sudden onset of sob and chest pressure since 1700. Denies hx of the same. Pt states he feels like he has been running and feels like he is having palpitations. Headache and body aches for the past few days.    Allergies No Known Allergies  Level of Care/Admitting Diagnosis ED Disposition    ED Disposition Condition [facility] Area: [place] [[id number]]  Level of Care: Telemetry [5]  Covid Evaluation: Confirmed COVID Negative  Diagnosis: Atrial fibrillation with rapid ventricular response [facility] [id number]  Admitting Physician: Eula Flax  Attending Physician: Rufina Falco ACHIENG [id number]  Estimated length of stay: past midnight tomorrow  Certification:: I certify this patient will need inpatient services for at least 2 midnights  PT Class (Do Not Modify): Inpatient [101]  PT Acc Code (Do Not Modify): Private [1]       B Medical/Surgery History Past Medical History:  Diagnosis Date  . Arthritis    psoriatic  . Hypertension   . Sleep apnea    USES CPAP   Past Surgical History:  Procedure Laterality Date  . APPENDECTOMY  1989  . KNEE ARTHROSCOPY  2006  . KNEE ARTHROSCOPY WITH MEDIAL MENISECTOMY Right 10/26/2017   Procedure: KNEE ARTHROSCOPY WITH MEDIAL MENISECTOMY;  Surgeon: Leim Fabry, MD;  Location: [facility] ORS;  Service: Orthopedics;  Laterality: Right;  . KNEE ARTHROSCOPY WITH MEDIAL MENISECTOMY Left 02/11/2018   Procedure: KNEE ARTHROSCOPY WITH PARTIAL MEDIAL MENISECTOMY   patellofemoral synovectomy. ;  Surgeon: Leim Fabry,  MD;  Location: [place];  Service: Orthopedics;  Laterality: Left;  sleep apnea  . ROTATOR CUFF REPAIR Right      A IV Location/Drains/Wounds Patient Lines/Drains/Airways Status   Active Line/Drains/Airways    Name:   Placement date:   Placement time:   Site:   Days:   Peripheral IV 11/24/18 Right Antecubital   11/24/18    2059    Antecubital   1   Incision (Closed) 10/26/17 Knee Right   10/26/17    1333     395   Incision (Closed) 02/11/18 Knee Left   02/11/18    1607     287   Incision - 2 Ports Other (Comment) Lateral Lateral   10/26/17    -     395          Intake/Output Last 24 hours No intake or output data in the 24 hours ending 11/25/18 0008  Labs/Imaging Results for orders placed or performed during the hospital encounter of 11/24/18 (from the past 48 hour(s))  CBC     Status: None   Collection Time: 11/24/18  8:58 PM  Result Value Ref Range   WBC 9.1 4.0 - 10.5 K/uL   RBC 5.01 4.22 - 5.81 MIL/uL   Hemoglobin 15.2 13.0 - 17.0 g/dL   HCT 42.4 39.0 - 52.0 %   MCV 84.6 80.0 - 100.0 fL   MCH 30.3 26.0 - 34.0 pg   MCHC 35.8 30.0 - 36.0 g/dL   RDW 12.1 11.5 - 15.5 %   Platelets 233 150 - 400 K/uL  nRBC 0.0 0.0 - 0.2 %    Comment: Performed at [facility], [address] [place], [place] [address]  Comprehensive metabolic panel     Status: Abnormal   Collection Time: 11/24/18  8:58 PM  Result Value Ref Range   Sodium 141 135 - 145 mmol/L   Potassium 3.5 3.5 - 5.1 mmol/L   Chloride 104 98 - 111 mmol/L   CO2 28 22 - 32 mmol/L   Glucose, Bld 138 (H) 70 - 99 mg/dL   BUN 19 6 - 20 mg/dL   Creatinine, Ser 0.87 0.61 - 1.24 mg/dL   Calcium 9.8 8.9 - 10.3 mg/dL   Total Protein 8.2 (H) 6.5 - 8.1 g/dL   Albumin 4.9 3.5 - 5.0 g/dL   AST 17 15 - 41 U/L   ALT 24 0 - 44 U/L   Alkaline Phosphatase 58 38 - 126 U/L   Total Bilirubin 0.7 0.3 - 1.2 mg/dL   GFR calc non Af Amer >60 >60 mL/min   GFR calc Af Amer >60 >60 mL/min   Anion gap 9 5 - 15     Comment: Performed at [facility], [place], [place] [address]  Troponin I (High Sensitivity)     Status: None   Collection Time: 11/24/18  8:58 PM  Result Value Ref Range   Troponin I (High Sensitivity) 5 <18 ng/L    Comment: (NOTE) Elevated high sensitivity troponin I (hsTnI) values and significant  changes across serial measurements may suggest ACS but many other  chronic and acute conditions are known to elevate hsTnI results.  Refer to the "Links" section for chest pain algorithms and additional  guidance. Performed at [facility], [address] [place], [place] [address]   SARS Coronavirus 2 [facility] order, Performed in [facility] hospital lab) Nasopharyngeal Nasopharyngeal Swab     Status: None   Collection Time: 11/24/18  8:58 PM   Specimen: Nasopharyngeal Swab  Result Value Ref Range   SARS Coronavirus 2 NEGATIVE NEGATIVE    Comment: (NOTE) If result is NEGATIVE SARS-CoV-2 target nucleic acids are NOT DETECTED. The SARS-CoV-2 RNA is generally detectable in upper and lower  respiratory specimens during the acute phase of infection. The lowest  concentration of SARS-CoV-2 viral copies this assay can detect is 250  copies / mL. A negative result does not preclude SARS-CoV-2 infection  and should not be used as the sole basis for treatment or other  patient management decisions.  A negative result may occur with  improper specimen collection / handling, submission of specimen other  than nasopharyngeal swab, presence of viral mutation(s) within the  areas targeted by this assay, and inadequate number of viral copies  (<250 copies / mL). A negative result must be combined with clinical  observations, patient history, and epidemiological information. If result is POSITIVE SARS-CoV-2 target nucleic acids are DETECTED. The SARS-CoV-2 RNA is generally detectable in upper and lower  respiratory specimens dur ing the acute phase of  infection.  Positive  results are indicative of active infection with SARS-CoV-2.  Clinical  correlation with patient history and other diagnostic information is  necessary to determine patient infection status.  Positive results do  not rule out bacterial infection or co-infection with other viruses. If result is PRESUMPTIVE POSTIVE SARS-CoV-2 nucleic acids MAY BE PRESENT.   A presumptive positive result was obtained on the submitted specimen  and confirmed on repeat testing.  While 2019 novel coronavirus  (SARS-CoV-2)  nucleic acids may be present in the submitted sample  additional confirmatory testing may be necessary for epidemiological  and / or clinical management purposes  to differentiate between  SARS-CoV-2 and other Sarbecovirus currently known to infect humans.  If clinically indicated additional testing with an alternate test  methodology [phone]) is advised. The SARS-CoV-2 RNA is generally  detectable in upper and lower respiratory sp ecimens during the acute  phase of infection. The expected result is Negative. Fact Sheet for Patients:  StrictlyIdeas.no Fact Sheet for Healthcare Providers: BankingDealers.co.za This test is not yet approved or cleared by the [place] FDA and has been authorized for detection and/or diagnosis of SARS-CoV-2 by FDA under an Emergency Use Authorization (EUA).  This EUA will remain in effect (meaning this test can be used) for the duration of the COVID-19 declaration under Section 564(b)(1) of the Act, 21 U.S.C. section 360bbb-3(b)(1), unless the authorization is terminated or revoked sooner. Performed at [facility], [address] [place], [place] [address]    No results found.  Pending Labs Unresulted Labs (From admission, onward)    Start     Ordered   11/24/18 2244  TSH  Add-on,   AD     11/24/18 2245   11/24/18 2244  Hemoglobin A1c  Add-on,   AD     11/24/18 2245    11/24/18 2244  Lipid panel  Add-on,   AD     11/24/18 2245   11/24/18 2241  HIV Antibody (routine testing w rflx)  (HIV Antibody (Routine testing w reflex) panel)  Once,   STAT     11/24/18 2245   11/24/18 2241  HIV4GL Save Tube  (HIV Antibody (Routine testing w reflex) panel)  Once,   STAT     11/24/18 2245          Vitals/Pain Today's Vitals   11/24/18 2302 11/24/18 2309 11/24/18 2330 11/24/18 2350  BP: 123/89  122/87 117/84  Pulse:      Resp: 20   18  Temp:      TempSrc:      SpO2: 99%  95% 96%  PainSc:  0-No pain      Isolation Precautions No active isolations  Medications Medications  diltiazem (CARDIZEM) 100 mg in dextrose 5 % 100 mL (1 mg/mL) infusion (10 mg/hr Intravenous Rate/Dose Change 11/24/18 2255)  Melatonin TABS 5 mg (has no administration in time range)  multivitamin with minerals tablet 1 tablet (has no administration in time range)  lisinopril (ZESTRIL) tablet 40 mg (has no administration in time range)  hydrochlorothiazide (HYDRODIURIL) tablet 25 mg (has no administration in time range)  HYDROcodone-acetaminophen (NORCO/VICODIN) 5-325 MG per tablet 1-2 tablet (has no administration in time range)  Apremilast TABS 30 mg (has no administration in time range)  acetaminophen (TYLENOL) tablet 650 mg (has no administration in time range)  ondansetron (ZOFRAN) injection 4 mg (4 mg Intravenous Given 11/24/18 2255)  enoxaparin (LOVENOX) injection 40 mg (has no administration in time range)  ondansetron (ZOFRAN) 4 MG/2ML injection (has no administration in time range)  diltiazem (CARDIZEM) injection 10 mg (10 mg Intravenous Given 11/24/18 2101)  sodium chloride 0.9 % bolus 1,000 mL (1,000 mLs Intravenous New Bag/Given 11/24/18 2103)  diltiazem (CARDIZEM) injection 10 mg (10 mg Intravenous Given 11/24/18 2115)    Mobility walks Low fall risk   Focused Assessments Cardiac Assessment Handoff:  Cardiac Rhythm: Atrial fibrillation No results found for: CKTOTAL,  CKMB, CKMBINDEX, TROPONINI No results found for: DDIMER Does  the Patient currently have chest pain? No      R Recommendations: See Admitting Provider Note  Report given to:   Additional Notes:

## 2018-11-25 NOTE — Progress Notes (Signed)
*  PRELIMINARY RESULTS* Echocardiogram 2D Echocardiogram has been performed.  Sherrie Sport 11/25/2018, 9:54 AM

## 2018-11-25 NOTE — Consult Note (Signed)
ANTICOAGULATION CONSULT NOTE:  Pharmacy Consult for: apixaban dosing Indication: atrial fibrillation  No Known Allergies  Patient Measurements: Height: 5\' 10"  (177.8 cm) Weight: 265 lb 10.5 oz (120.5 kg) IBW/kg (Calculated) : 73  Vital Signs: Temp: 97.8 F (36.6 C) (10/08 0200) Temp Source: Oral (10/08 0200) BP: 104/76 (10/08 0634) Pulse Rate: 145 (10/08 0700)  Labs: Recent Labs    11/24/18 2058 11/25/18 0141  HGB 15.2  --   HCT 42.4  --   PLT 233  --   CREATININE 0.87  --   TROPONINIHS 5 11   Estimated Creatinine Clearance: 124.8 mL/min (by C-G formula based on SCr of 0.87 mg/dL).  Assessment:  Timothy Doyle is a 61 YOM who was referred for evaluation of new-onset atrial fibrillation with RVR. He has a hx of HTN. Patient reports recent episodes of exertional dyspnea climbing stairs, along with an acute onset of dyspnea and palpitations at rest yesterday evening. Timothy Doyle presented to ED shortly after with a HR of 177bpm with a narrow QRS.   PMH includes: HTN, psoriatic arthritis, and sleep apnea.  Patient received 40mg  of enoxaparin this morning at 0811 and 80mg  at 1106 (120mg  total), but was discontinued shortly after with a plan to use apixaban 5mg  PO BID. CHA2DS2-VASc score of 1 (HTN). Results from ECHO pending.   Plan:   Based on enoxaparin administration this AM, will initiate apixaban 5mg  PO BID starting at 2200. Apixaban to be continued in anticipation of possible chemical or electrical cardioversion as outpatient.    Cardizem drip to be discontinued, with Cardizem CD 180mg  PO daily initiated. Blood pressure medications (lisinopril and HCTZ) to be held given recent low blood pressure, may be resumed when it has stabilized.   Monitoring:  Monitor CBC (platelets) every three days. Monitor for signs/sxs of bleeding, clotting, or stroke.  Raiford Simmonds, PharmD Candidate 11/25/2018,11:42 AM

## 2018-11-25 NOTE — Progress Notes (Signed)
[place] [facility] [place] NAME: Timothy Doyle    MR#:  [id number]  DATE OF BIRTH:  1965/12/19  SUBJECTIVE:  CHIEF COMPLAINT:   Chief Complaint  Patient presents with  . Chest Pain  . Shortness of Breath   -Came in with cardiac symptoms and was noted to be in A. fib with RVR.  Still remains on the Cardizem drip this morning. -Symptoms have improved some.  REVIEW OF SYSTEMS:  Review of Systems  Constitutional: Negative for chills, fever and malaise/fatigue.  HENT: Negative for ear discharge, hearing loss and nosebleeds.   Eyes: Negative for blurred vision and double vision.  Respiratory: Positive for shortness of breath. Negative for cough and wheezing.   Cardiovascular: Positive for chest pain and palpitations. Negative for leg swelling.  Gastrointestinal: Negative for abdominal pain, constipation, diarrhea, nausea and vomiting.  Genitourinary: Negative for dysuria.  Musculoskeletal: Negative for myalgias.  Neurological: Negative for dizziness, focal weakness, seizures, weakness and headaches.  Psychiatric/Behavioral: Negative for depression.    DRUG ALLERGIES:  No Known Allergies  VITALS:  Blood pressure (!) 131/98, pulse 93, temperature 98.1 F (36.7 C), temperature source Oral, resp. rate 10, height 5\' 10"  (1.778 m), weight 120.5 kg, SpO2 99 %.  PHYSICAL EXAMINATION:  Physical Exam   GENERAL:  53 y.o.-year-old over weight patient lying in the bed with no acute distress.  EYES: Pupils equal, round, reactive to light and accommodation. No scleral icterus. Extraocular muscles intact.  HEENT: Head atraumatic, normocephalic. Oropharynx and nasopharynx clear.  NECK:  Supple, no jugular venous distention. No thyroid enlargement, no tenderness.  LUNGS: Normal breath sounds bilaterally, no wheezing, rales,rhonchi or crepitation. No use of accessory muscles of respiration.  CARDIOVASCULAR: S1, S2 irregular rhythm but normal rate. No  murmurs, rubs, or gallops.  ABDOMEN: Soft, nontender, nondistended. Bowel sounds present. No organomegaly or mass.  EXTREMITIES: No pedal edema, cyanosis, or clubbing.  NEUROLOGIC: Cranial nerves II through XII are intact. Muscle strength 5/5 in all extremities. Sensation intact. Gait not checked.  PSYCHIATRIC: The patient is alert and oriented x 3.  SKIN: No obvious rash, lesion, or ulcer.    LABORATORY PANEL:   CBC Recent Labs  Lab 11/24/18 2058  WBC 9.1  HGB 15.2  HCT 42.4  PLT 233   ------------------------------------------------------------------------------------------------------------------  Chemistries  Recent Labs  Lab 11/24/18 2058  NA 141  K 3.5  CL 104  CO2 28  GLUCOSE 138*  BUN 19  CREATININE 0.87  CALCIUM 9.8  AST 17  ALT 24  ALKPHOS 58  BILITOT 0.7   ------------------------------------------------------------------------------------------------------------------  Cardiac Enzymes No results for input(s): TROPONINI in the last 168 hours. ------------------------------------------------------------------------------------------------------------------  RADIOLOGY:  Dg Chest 1 View  Result Date: 11/25/2018 CLINICAL DATA:  Shortness of breath and chest pressure EXAM: CHEST  1 VIEW COMPARISON:  None. FINDINGS: The heart size and mediastinal contours are within normal limits. Both lungs are clear. The visualized skeletal structures are unremarkable. IMPRESSION: No acute cardiopulmonary process. Electronically Signed   By: Prudencio Pair M.D.   On: 11/25/2018 01:32    EKG:   Orders placed or performed during the hospital encounter of 11/24/18  . EKG 12-Lead  . EKG 12-Lead  . EKG 12-lead    ASSESSMENT AND PLAN:   53 year old male with past medical history significant for hypertension, sleep apnea on CPAP presents to hospital secondary to chest pain, palpitations and dyspnea going on and off for 2 weeks now.  1.  New onset  atrial fibrillation with  rapid ventricular response-admitted to stepdown for Cardizem drip -Rate seems to be well controlled.  Will be transition to oral Cardizem today -TSH is within normal limits.  Echocardiogram is pending -Appreciate cardiology consult. -Patient started on Eliquis for anticoagulation and also in preparation for future chemical versus electrical cardioversion as outpatient if needed.  2.  Obstructive sleep apnea-has not been using his CPAP for 2 months now.  Recommended to start restart CPAP again and follow-up echocardiogram for right-sided changes  3.  Hypertension-on lisinopril, hydrochlorothiazide and Cardizem now.  Blood pressure is low normal, consider discontinuing lisinopril and HCTZ.  4.  DVT prophylaxis-Eliquis     All the records are reviewed and case discussed with Care Management/Social Workerr. Management plans discussed with the patient, family and they are in agreement.  CODE STATUS: Full code  TOTAL TIME TAKING CARE OF THIS PATIENT: 38 minutes.   POSSIBLE D/C IN 2 DAYS, DEPENDING ON CLINICAL CONDITION.   Gladstone Lighter M.D on 11/25/2018 at 1:35 PM  Between 7am to 6pm - Pager - [phone]  After 6pm go to [email or web address] - password [email or web address]  Office  [phone]  CC: Primary care physician; Tracie Harrier, MD

## 2018-11-25 NOTE — Progress Notes (Signed)
Called report to [place], Therapist, sports. Pack patient's belongings and will send with him.

## 2018-11-26 LAB — BASIC METABOLIC PANEL
Anion gap: 10 (ref 5–15)
BUN: 16 mg/dL (ref 6–20)
CO2: 28 mmol/L (ref 22–32)
Calcium: 9.1 mg/dL (ref 8.9–10.3)
Chloride: 103 mmol/L (ref 98–111)
Creatinine, Ser: 0.71 mg/dL (ref 0.61–1.24)
GFR calc Af Amer: >60 mL/min (ref >60)
GFR calc non Af Amer: >60 mL/min (ref >60)
Glucose, Bld: 108 mg/dL — ABNORMAL HIGH (ref 70–99)
Potassium: 3.9 mmol/L (ref 3.5–5.1)
Sodium: 141 mmol/L (ref 135–145)

## 2018-11-26 LAB — MAGNESIUM: Magnesium: 2.2 mg/dL (ref 1.7–2.4)

## 2018-11-26 MED ORDER — APIXABAN 5 MG PO TABS: 5.0000 mg | ORAL_TABLET | Freq: Two times a day (BID) | ORAL | 2 refills | Status: AC

## 2018-11-26 MED ORDER — DILTIAZEM HCL ER COATED BEADS 180 MG PO CP24: 180.0000 mg | ORAL_CAPSULE | Freq: Every day | ORAL | 2 refills | Status: AC

## 2018-11-26 MED ORDER — LISINOPRIL 10 MG PO TABS: 10.0000 mg | ORAL_TABLET | ORAL | 2 refills | Status: AC

## 2018-11-26 NOTE — Progress Notes (Signed)
[facility]  SUBJECTIVE: The patient reports feeling much better this morning, and is now in sinus rhythm. He denies chest pain, palpitations, or shortness of breath.    Vitals:   11/25/18 2102 11/26/18 0500 11/26/18 0600 11/26/18 0752  BP: 137/79  122/87 (!) 124/93  Pulse: 63  67 64  Resp: 18  18 20   Temp: 97.9 F (36.6 C)  97.6 F (36.4 C) (!) 97.5 F (36.4 C)  TempSrc: Oral  Oral Oral  SpO2: 97%  98% 99%  Weight: 125.3 kg 125.3 kg    Height:         Intake/Output Summary (Last 24 hours) at 11/26/2018 0913 Last data filed at 11/25/2018 1824 Gross per 24 hour  Intake 480 ml  Output 1100 ml  Net -620 ml      PHYSICAL EXAM  General: Well developed, well nourished, in no acute distress HEENT:  Normocephalic and atramatic Neck:  No JVD.  Lungs: normal effort of breathing on room air. Heart: HRRR . Normal S1 and S2 without gallops or murmurs.  Abdomen: nondistended Msk:  No obvious deformity Extremities: No clubbing, cyanosis or edema.   Neuro: Alert and oriented X 3. Psych:  Good affect, responds appropriately   LABS: Basic Metabolic Panel: Recent Labs    11/24/18 2058 11/26/18 0614  NA 141 141  K 3.5 3.9  CL 104 103  CO2 28 28  GLUCOSE 138* 108*  BUN 19 16  CREATININE 0.87 0.71  CALCIUM 9.8 9.1  MG  --  2.2   Liver Function Tests: Recent Labs    11/24/18 2058  AST 17  ALT 24  ALKPHOS 58  BILITOT 0.7  PROT 8.2*  ALBUMIN 4.9   No results for input(s): LIPASE, AMYLASE in the last 72 hours. CBC: Recent Labs    11/24/18 2058  WBC 9.1  HGB 15.2  HCT 42.4  MCV 84.6  PLT 233   Cardiac Enzymes: No results for input(s): CKTOTAL, CKMB, CKMBINDEX, TROPONINI in the last 72 hours. BNP: Invalid input(s): POCBNP D-Dimer: No results for input(s): DDIMER in the last 72 hours. Hemoglobin A1C: Recent Labs    11/25/18 0141  HGBA1C 4.9   Fasting Lipid Panel: Recent Labs    11/25/18 0141  CHOL 159  HDL 37*  LDLCALC 98  TRIG 122  CHOLHDL  4.3   Thyroid Function Tests: Recent Labs    11/25/18 0141  TSH 4.886*   Anemia Panel: No results for input(s): VITAMINB12, FOLATE, FERRITIN, TIBC, IRON, RETICCTPCT in the last 72 hours.  Dg Chest 1 View  Result Date: 11/25/2018 CLINICAL DATA:  Shortness of breath and chest pressure EXAM: CHEST  1 VIEW COMPARISON:  None. FINDINGS: The heart size and mediastinal contours are within normal limits. Both lungs are clear. The visualized skeletal structures are unremarkable. IMPRESSION: No acute cardiopulmonary process. Electronically Signed   By: Prudencio Pair M.D.   On: 11/25/2018 01:32     Echo Normal left ventricular function with LVEF 60-65% with mild TR  TELEMETRY: normal sinus rhythm, rate 74 bpm  ASSESSMENT AND PLAN:  Active Problems:   Atrial fibrillation with rapid ventricular response ([place])    1. New onset atrial fibrillation, presenting to the ER in RVR, received IV diltiazem x 2, then diltiazem drip with rates in the 80s. Transitioned to oral Cardizem CD 180 mg, converted to sinus rhythm. Chads vasc score of 1. History of OSA with several month history of noncompliance of CPAP. 2D echocardiogram revealed normal left ventricular  function with LVEF 60-65% with mild TR. 2. Hypertension, on lisinopril and HCTZ at home.  Plan: 1. Continue Eliquis 5 mg BID for now; will discuss continuation as outpatient  2. Continue Cardizem CD 180 mg once daily 3. Continue lisinopril if blood pressure permits 4. Advised nightly CPAP compliance 5. Follow-up as outpatient with Dr. Saralyn Pilar Monday or Tuesday of next week (10/12 or 10/13) 6. Patient cleared for discharge today from cardiovascular standpoint  Clabe Seal, PA-C 11/26/2018 9:13 AM   The patient was also evaluated by Dr. Saralyn Pilar and the plan was made in collaboration with him.

## 2018-11-26 NOTE — Progress Notes (Signed)
Discussed discharge instructions to patient. Patient verbalized understanding and did not have any questions.

## 2018-11-26 NOTE — Discharge Summary (Signed)
[facility] NAME: Timothy Doyle    MR#:  [id number]  DATE OF BIRTH:  Jan 16, 1966  DATE OF ADMISSION:  11/24/2018   ADMITTING PHYSICIAN: Lang Snow, NP  DATE OF DISCHARGE:  11/26/18  PRIMARY CARE PHYSICIAN: Tracie Harrier, MD   ADMISSION DIAGNOSIS:   SOB (shortness of breath) [R06.02] New onset atrial fibrillation (HCC) [I48.91] Atrial fibrillation with tachycardic ventricular rate (HCC) [I48.91]  DISCHARGE DIAGNOSIS:   Active Problems:   Atrial fibrillation with rapid ventricular response ([facility])   SECONDARY DIAGNOSIS:   Past Medical History:  Diagnosis Date  . Arthritis    psoriatic  . Hypertension   . Sleep apnea    USES CPAP    HOSPITAL COURSE:    53 year old male with past medical history significant for hypertension, sleep apnea on CPAP presents to hospital secondary to chest pain, palpitations and dyspnea going on and off for 2 weeks now.  1.  New onset atrial fibrillation with rapid ventricular response-  -Admitted to stepdown unit for Cardizem drip.  Slowly weaned off the drip and started on oral Cardizem. -Currently converted to normal sinus rhythm -He is started on anticoagulation with Eliquis at this time.  Echocardiogram showing normal EF.  No acute findings at this time. -Appreciate cardiology consult. -Patient will closely follow-up with cardiology as outpatient.  2.  Obstructive sleep apnea-has not been using his CPAP for 2 months now.  -Recommended to restart using his CPAP  3.  Hypertension-since he is being started on oral Cardizem, his lisinopril dose has been reduced given soft blood pressures.  Discontinued hydrochlorothiazide.  Stable for discharge home today .  DISCHARGE CONDITIONS:   None  CONSULTS OBTAINED:   Cardiology by Dr. Saralyn Pilar  DRUG ALLERGIES:   No Known Allergies DISCHARGE MEDICATIONS:   Allergies as of 11/26/2018   No Known Allergies      Medication List    STOP taking these medications   hydrochlorothiazide 25 MG tablet Commonly known as: HYDRODIURIL     TAKE these medications   apixaban 5 MG Tabs tablet Commonly known as: ELIQUIS Take 1 tablet (5 mg total) by mouth 2 (two) times daily.   diltiazem 180 MG 24 hr capsule Commonly known as: CARDIZEM CD Take 1 capsule (180 mg total) by mouth daily. Start taking on: November 27, 2018   lisinopril 10 MG tablet Commonly known as: ZESTRIL Take 1 tablet (10 mg total) by mouth every morning. What changed:   medication strength  how much to take   Melatonin 5 MG Tabs Take 5 mg by mouth at bedtime.   Otezla 30 MG Tabs Generic drug: Apremilast Take 30 mg by mouth 2 (two) times daily.        DISCHARGE INSTRUCTIONS:   1.  PCP follow-up in 1 to 2 weeks 2.  Cardiology follow-up in 1 week  DIET:   Cardiac diet  ACTIVITY:   Activity as tolerated  OXYGEN:   Home Oxygen: No.  Oxygen Delivery: room air  DISCHARGE LOCATION:   home   If you experience worsening of your admission symptoms, develop shortness of breath, life threatening emergency, suicidal or homicidal thoughts you must seek medical attention immediately by calling 911 or calling your MD immediately  if symptoms less severe.  You Must read complete instructions/literature along with all the possible adverse reactions/side effects for all the Medicines you take and that have been prescribed to you. Take any new Medicines after you have  completely understood and accpet all the possible adverse reactions/side effects.   Please note  You were cared for by a hospitalist during your hospital stay. If you have any questions about your discharge medications or the care you received while you were in the hospital after you are discharged, you can call the unit and asked to speak with the hospitalist on call if the hospitalist that took care of you is not available. Once you are discharged, your primary  care physician will handle any further medical issues. Please note that NO REFILLS for any discharge medications will be authorized once you are discharged, as it is imperative that you return to your primary care physician (or establish a relationship with a primary care physician if you do not have one) for your aftercare needs so that they can reassess your need for medications and monitor your lab values.    On the day of Discharge:  VITAL SIGNS:   Blood pressure (!) 124/93, pulse 64, temperature (!) 97.5 F (36.4 C), temperature source Oral, resp. rate 20, height 5\' 10"  (1.778 m), weight 125.3 kg, SpO2 99 %.  PHYSICAL EXAMINATION:    GENERAL:  53 y.o.-year-old overweight patient lying in the bed with no acute distress.  EYES: Pupils equal, round, reactive to light and accommodation. No scleral icterus. Extraocular muscles intact.  HEENT: Head atraumatic, normocephalic. Oropharynx and nasopharynx clear.  NECK:  Supple, no jugular venous distention. No thyroid enlargement, no tenderness.  LUNGS: Normal breath sounds bilaterally, no wheezing, rales,rhonchi or crepitation. No use of accessory muscles of respiration.  CARDIOVASCULAR: S1, S2 normal. No murmurs, rubs, or gallops.  ABDOMEN: Soft, non-tender, non-distended. Bowel sounds present. No organomegaly or mass.  EXTREMITIES: No pedal edema, cyanosis, or clubbing.  NEUROLOGIC: Cranial nerves II through XII are intact. Muscle strength 5/5 in all extremities. Sensation intact. Gait not checked.  PSYCHIATRIC: The patient is alert and oriented x 3.  SKIN: No obvious rash, lesion, or ulcer.   DATA REVIEW:   CBC Recent Labs  Lab 11/24/18 2058  WBC 9.1  HGB 15.2  HCT 42.4  PLT 233    Chemistries  Recent Labs  Lab 11/24/18 2058 11/26/18 0614  NA 141 141  K 3.5 3.9  CL 104 103  CO2 28 28  GLUCOSE 138* 108*  BUN 19 16  CREATININE 0.87 0.71  CALCIUM 9.8 9.1  MG  --  2.2  AST 17  --   ALT 24  --   ALKPHOS 58  --    BILITOT 0.7  --      Microbiology Results  Results for orders placed or performed during the hospital encounter of 11/24/18  SARS Coronavirus 2 [facility] order, Performed in [facility] hospital lab) Nasopharyngeal Nasopharyngeal Swab     Status: None   Collection Time: 11/24/18  8:58 PM   Specimen: Nasopharyngeal Swab  Result Value Ref Range Status   SARS Coronavirus 2 NEGATIVE NEGATIVE Final    Comment: (NOTE) If result is NEGATIVE SARS-CoV-2 target nucleic acids are NOT DETECTED. The SARS-CoV-2 RNA is generally detectable in upper and lower  respiratory specimens during the acute phase of infection. The lowest  concentration of SARS-CoV-2 viral copies this assay can detect is 250  copies / mL. A negative result does not preclude SARS-CoV-2 infection  and should not be used as the sole basis for treatment or other  patient management decisions.  A negative result may occur with  improper specimen collection / handling, submission of specimen  other  than nasopharyngeal swab, presence of viral mutation(s) within the  areas targeted by this assay, and inadequate number of viral copies  (<250 copies / mL). A negative result must be combined with clinical  observations, patient history, and epidemiological information. If result is POSITIVE SARS-CoV-2 target nucleic acids are DETECTED. The SARS-CoV-2 RNA is generally detectable in upper and lower  respiratory specimens dur ing the acute phase of infection.  Positive  results are indicative of active infection with SARS-CoV-2.  Clinical  correlation with patient history and other diagnostic information is  necessary to determine patient infection status.  Positive results do  not rule out bacterial infection or co-infection with other viruses. If result is PRESUMPTIVE POSTIVE SARS-CoV-2 nucleic acids MAY BE PRESENT.   A presumptive positive result was obtained on the submitted specimen  and confirmed on repeat testing.  While 2019  novel coronavirus  (SARS-CoV-2) nucleic acids may be present in the submitted sample  additional confirmatory testing may be necessary for epidemiological  and / or clinical management purposes  to differentiate between  SARS-CoV-2 and other Sarbecovirus currently known to infect humans.  If clinically indicated additional testing with an alternate test  methodology [phone]) is advised. The SARS-CoV-2 RNA is generally  detectable in upper and lower respiratory sp ecimens during the acute  phase of infection. The expected result is Negative. Fact Sheet for Patients:  StrictlyIdeas.no Fact Sheet for Healthcare Providers: BankingDealers.co.za This test is not yet approved or cleared by the [place] FDA and has been authorized for detection and/or diagnosis of SARS-CoV-2 by FDA under an Emergency Use Authorization (EUA).  This EUA will remain in effect (meaning this test can be used) for the duration of the COVID-19 declaration under Section 564(b)(1) of the Act, 21 U.S.C. section 360bbb-3(b)(1), unless the authorization is terminated or revoked sooner. Performed at [facility], [address] [place], [place] [address]   MRSA PCR Screening     Status: None   Collection Time: 11/25/18 11:29 AM   Specimen: Nasal Mucosa; Nasopharyngeal  Result Value Ref Range Status   MRSA by PCR NEGATIVE NEGATIVE Final    Comment:        The GeneXpert MRSA Assay (FDA approved for NASAL specimens only), is one component of a comprehensive MRSA colonization surveillance program. It is not intended to diagnose MRSA infection nor to guide or monitor treatment for MRSA infections. Performed at [facility], [address], [place], [place] [address]     RADIOLOGY:  No results found.   Management plans discussed with the patient, family and they are in agreement.  CODE STATUS:     Code Status Orders  (From admission,  onward)         Start     Ordered   11/24/18 2243  Full code  Continuous     11/24/18 2245        Code Status History    This patient has a current code status but no historical code status.   Advance Care Planning Activity    Advance Directive Documentation     Most Recent Value  Type of Advance Directive  Healthcare Power of Attorney, Living will  Pre-existing out of facility DNR order (yellow form or pink MOST form)  -  "MOST" Form in Place?  -      TOTAL TIME TAKING CARE OF THIS PATIENT: 38 minutes.    Gladstone Lighter M.D on 11/26/2018 at 12:42 PM  Between 7am to  6pm - Pager - [phone]  After 6pm go to [email or web address] - Proofreader  [facility]  Office  [phone]  CC: Primary care physician; Tracie Harrier, MD   Note: This dictation was prepared with Dragon dictation along with smaller phrase technology. Any transcriptional errors that result from this process are unintentional.

## 2018-11-26 NOTE — Plan of Care (Signed)
  Problem: Clinical Measurements: Goal: Cardiovascular complication will be avoided Outcome: Progressing   Problem: Pain Managment: Goal: General experience of comfort will improve Outcome: Progressing   Problem: Activity: Goal: Ability to tolerate increased activity will improve Outcome: Progressing   Problem: Cardiac: Goal: Ability to achieve and maintain adequate cardiopulmonary perfusion will improve Outcome: Progressing

## 2019-03-23 DIAGNOSIS — D239 Other benign neoplasm of skin, unspecified: Secondary | ICD-10-CM

## 2019-03-23 HISTORY — DX: Other benign neoplasm of skin, unspecified: D23.9

## 2019-09-12 ENCOUNTER — Other Ambulatory Visit
Admission: RE | Admit: 2019-09-12 | Discharge: 2019-09-12 | Disposition: A | Discharge status: Home / Self Care | Payer: BC Managed Care – PPO | Source: Ambulatory Visit | Attending: Physician Assistant | Admitting: Physician Assistant

## 2019-09-12 DIAGNOSIS — Z79899 Other long term (current) drug therapy: Secondary | ICD-10-CM | POA: Insufficient documentation

## 2019-09-12 DIAGNOSIS — R7989 Other specified abnormal findings of blood chemistry: Secondary | ICD-10-CM | POA: Insufficient documentation

## 2019-09-12 LAB — BRAIN NATRIURETIC PEPTIDE: B Natriuretic Peptide: 34.7 pg/mL (ref 0.0–100.0)

## 2019-11-30 IMAGING — DX DG CHEST 1V
1 series · 1 of 1 positions shown · non-contrast
Comparison: None.

CLINICAL DATA: Shortness of breath and chest pressure

EXAM:
CHEST  1 VIEW

[chest ap]
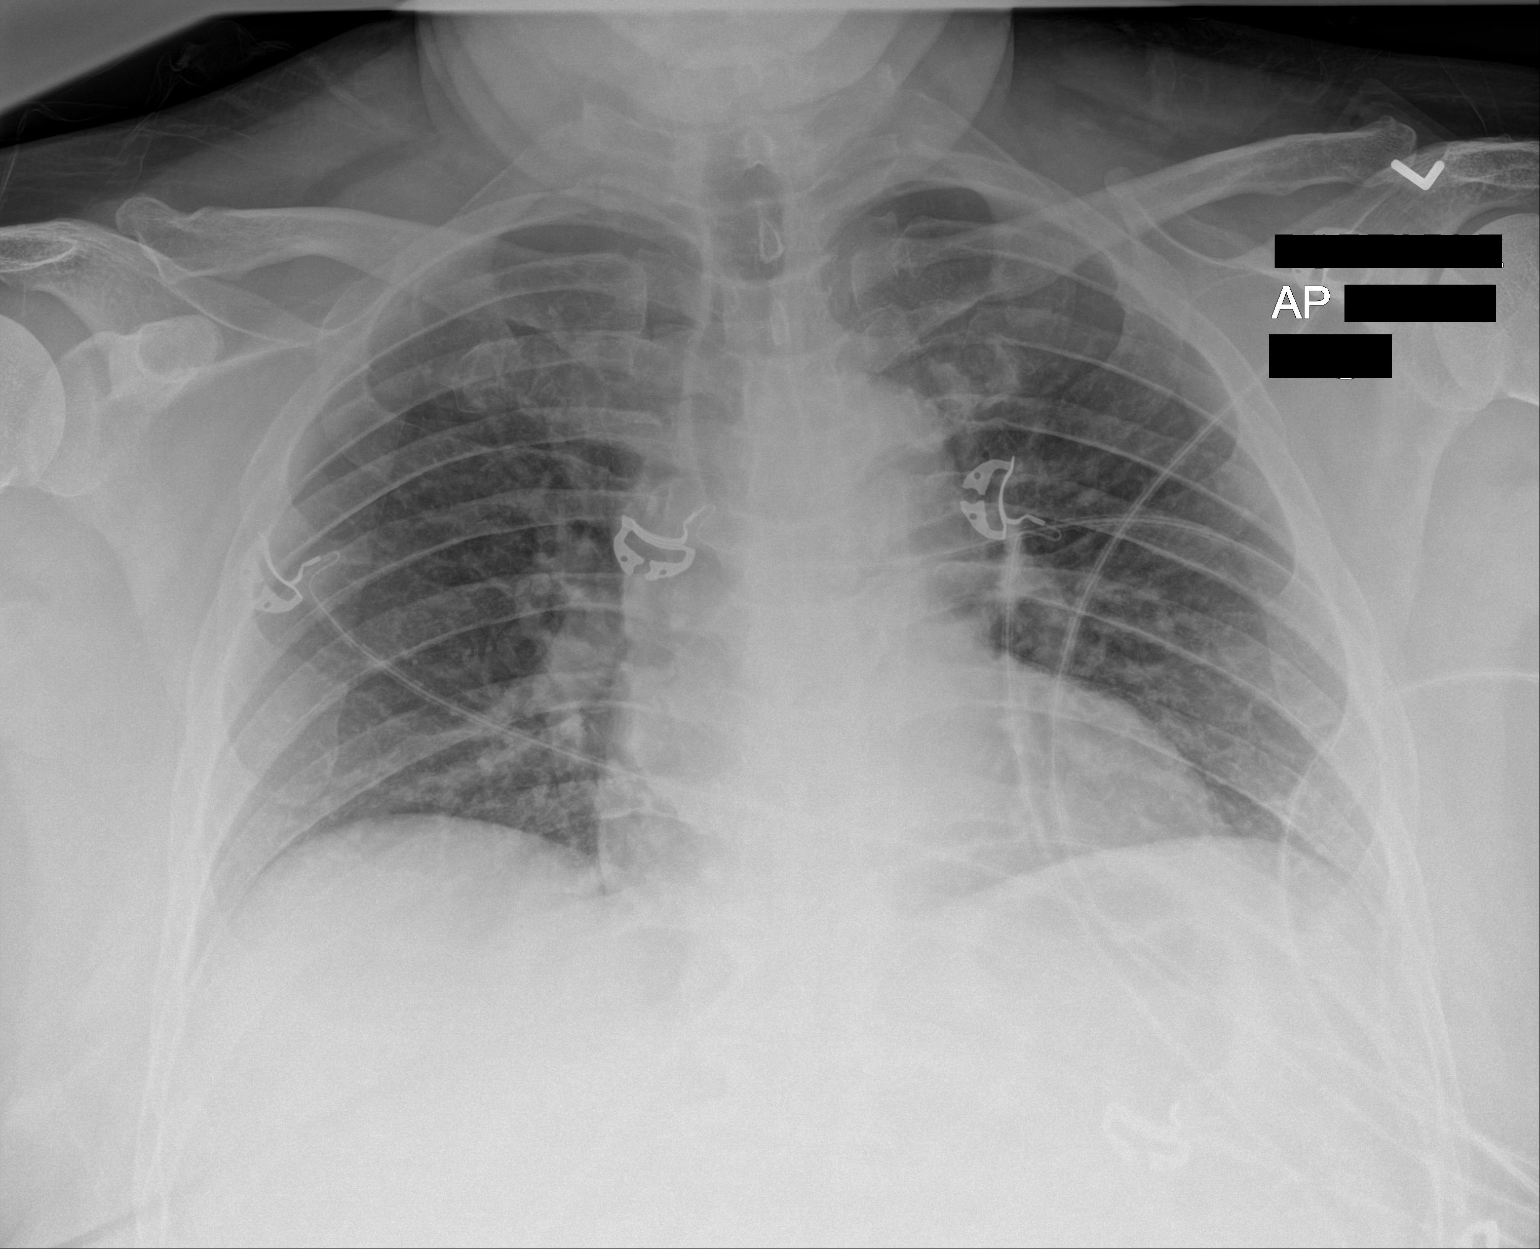

[1 of 1 positions shown; findings below may reference images not displayed]

FINDINGS: The heart size and mediastinal contours are within normal limits.
Both lungs are clear. The visualized skeletal structures are
unremarkable.
IMPRESSION: No acute cardiopulmonary process.

## 2020-03-06 ENCOUNTER — Other Ambulatory Visit: Payer: Self-pay

## 2020-03-06 DIAGNOSIS — L309 Dermatitis, unspecified: Secondary | ICD-10-CM

## 2020-03-06 MED ORDER — MOMETASONE FUROATE 0.1 % EX CREA: TOPICAL_CREAM | CUTANEOUS | 2 refills | Status: DC

## 2020-03-06 NOTE — Progress Notes (Signed)
Refill from [facility] 

## 2020-03-08 MED ORDER — MOMETASONE FUROATE 0.1 % EX CREA: TOPICAL_CREAM | CUTANEOUS | 2 refills | Status: AC

## 2020-03-08 NOTE — Progress Notes (Signed)
Prescription clarification for [facility]

## 2020-03-08 NOTE — Addendum Note (Signed)
Addended by: Harriett Sine on: 03/08/2020 08:21 AM   Modules accepted: Orders

## 2020-03-28 ENCOUNTER — Encounter: Payer: BC Managed Care – PPO | Admitting: Dermatology
